# Patient Record
Sex: Male | Born: 1950
Health system: Southern US, Community
[De-identification: ages and names within clinical notes are randomized; demographics above are authoritative.]

## PROBLEM LIST (undated history)

## (undated) DIAGNOSIS — Z8679 Personal history of other diseases of the circulatory system: Secondary | ICD-10-CM

## (undated) DIAGNOSIS — E7800 Pure hypercholesterolemia, unspecified: Secondary | ICD-10-CM

## (undated) DIAGNOSIS — I10 Essential (primary) hypertension: Secondary | ICD-10-CM

## (undated) DIAGNOSIS — E119 Type 2 diabetes mellitus without complications: Secondary | ICD-10-CM

## (undated) DIAGNOSIS — M1990 Unspecified osteoarthritis, unspecified site: Secondary | ICD-10-CM

## (undated) HISTORY — PX: OTHER SURGICAL HISTORY: SHX169

---

## 2016-01-15 NOTE — H&P (Signed)
TOTAL HIP ADMISSION H&P  Patient is admitted for left total hip arthroplasty, anterior approach.  Subjective:  Chief Complaint:    Left hip primary OA / pain  HPI: Julian Porter, 65 y.o. male, has a history of pain and functional disability in the left hip(s) due to arthritis and patient has failed non-surgical conservative treatments for greater than 12 weeks to include NSAID's and/or analgesics and activity modification.  Onset of symptoms was gradual starting 6-7 years ago with gradually worsening course since that time.The patient noted no past surgery on the left hip(s).  Patient currently rates pain in the left hip at 8 out of 10 with activity. Patient has night pain, worsening of pain with activity and weight bearing, trendelenberg gait, pain that interfers with activities of daily living and pain with passive range of motion. Patient has evidence of periarticular osteophytes and joint space narrowing by imaging studies. This condition presents safety issues increasing the risk of falls.  There is no current active infection.   Risks, benefits and expectations were discussed with the patient.  Risks including but not limited to the risk of anesthesia, blood clots, nerve damage, blood vessel damage, failure of the prosthesis, infection and up to and including death.  Patient understand the risks, benefits and expectations and wishes to proceed with surgery.   PCP: Horatio Pel, MD  D/C Plans:      Home  Post-op Meds:       No Rx given  Tranexamic Acid:      To be given - IV   Decadron:      Is to be given  FYI:     ASA  Norco     Past Medical History:  Diagnosis Date  . Arthritis    OA  . Elevated cholesterol   . History of right bundle branch block   . Hypertension     Past Surgical History:  Procedure Laterality Date  . EYE STY REMOVED  2 1/2 YRS AGO  . INGROWN HAIR REMOVED ON HAIR LINE  4 YRS AGO    No prescriptions prior to admission.   No Known Allergies    Social History  Substance Use Topics  . Smoking status: Never Smoker  . Smokeless tobacco: Never Used  . Alcohol use Yes     Comment: [place] [employer]       Review of Systems  Constitutional: Negative.   HENT: Negative.   Eyes: Negative.   Respiratory: Negative.   Cardiovascular: Negative.   Gastrointestinal: Negative.   Genitourinary: Negative.   Musculoskeletal: Positive for joint pain.  Skin: Negative.   Neurological: Negative.   Endo/Heme/Allergies: Negative.   Psychiatric/Behavioral: Negative.     Objective:  Physical Exam  Constitutional: He is oriented to person, place, and time. He appears well-developed.  HENT:  Head: Normocephalic.  Eyes: Pupils are equal, round, and reactive to light.  Neck: Neck supple. No JVD present. No tracheal deviation present. No thyromegaly present.  Cardiovascular: Normal rate, regular rhythm, normal heart sounds and intact distal pulses.   Respiratory: Effort normal and breath sounds normal. No respiratory distress. He has no wheezes.  GI: Soft. There is no tenderness. There is no guarding.  Musculoskeletal:       Left hip: He exhibits decreased range of motion, decreased strength, tenderness and bony tenderness. He exhibits no swelling, no deformity and no laceration.  Lymphadenopathy:    He has no cervical adenopathy.  Neurological: He is alert and oriented to person, place, and  time.  Skin: Skin is warm and dry.  Psychiatric: He has a normal mood and affect.      Imaging Review Plain radiographs demonstrate severe degenerative joint disease of the left hip(s). The bone quality appears to be good for age and reported activity level.  Assessment/Plan:  End stage arthritis, left hip(s)  The patient history, physical examination, clinical judgement of the provider and imaging studies are consistent with end stage degenerative joint disease of the left hip(s) and total hip arthroplasty is deemed medically necessary. The treatment  options including medical management, injection therapy, arthroscopy and arthroplasty were discussed at length. The risks and benefits of total hip arthroplasty were presented and reviewed. The risks due to aseptic loosening, infection, stiffness, dislocation/subluxation,  thromboembolic complications and other imponderables were discussed.  The patient acknowledged the explanation, agreed to proceed with the plan and consent was signed. Patient is being admitted for inpatient treatment for surgery, pain control, PT, OT, prophylactic antibiotics, VTE prophylaxis, progressive ambulation and ADL's and discharge planning.The patient is planning to be discharged home.     West Pugh Riku Buttery   PA-C  01/26/2016, 7:43 AM

## 2016-01-20 ENCOUNTER — Other Ambulatory Visit (HOSPITAL_COMMUNITY): Payer: Self-pay | Admitting: *Deleted

## 2016-01-20 NOTE — Patient Instructions (Signed)
Julian Porter  01/20/2016   Your procedure is scheduled on: 01-27-16  Report to [facility] Main  Entrance take [facility]  elevators to 3rd floor to  [place] at 600 AM.  Call this number if you have problems the morning of surgery [phone]   Remember: ONLY 1 PERSON MAY GO WITH YOU TO SHORT STAY TO GET  READY MORNING OF [place].  Do not eat food or drink liquids :After Midnight.     Take these medicines the morning of surgery with A SIP OF WATER: simvastatin (zocor)              You may not have any metal on your body including hair pins and              piercings  Do not wear jewelry, make-up, lotions, powders or perfumes, deodorant             Do not wear nail polish.  Do not shave  48 hours prior to surgery.              Men may shave face and neck.   Do not bring valuables to the hospital. [place].  Contacts, dentures or bridgework may not be worn into surgery.  Leave suitcase in the car. After surgery it may be brought to your room.               Please read over the following fact sheets you were given: _____________________________________________________________________             [facility] - Preparing for Surgery Before surgery, you can play an important role.  Because skin is not sterile, your skin needs to be as free of germs as possible.  You can reduce the number of germs on your skin by washing with CHG (chlorahexidine gluconate) soap before surgery.  CHG is an antiseptic cleaner which kills germs and bonds with the skin to continue killing germs even after washing. Please DO NOT use if you have an allergy to CHG or antibacterial soaps.  If your skin becomes reddened/irritated stop using the CHG and inform your nurse when you arrive at Short Stay. Do not shave (including legs and underarms) for at least 48 hours prior to the first CHG shower.  You may shave your face/neck. Please  follow these instructions carefully:  1.  Shower with CHG Soap the night before surgery and the  morning of Surgery.  2.  If you choose to wash your hair, wash your hair first as usual with your  normal  shampoo.  3.  After you shampoo, rinse your hair and body thoroughly to remove the  shampoo.                           4.  Use CHG as you would any other liquid soap.  You can apply chg directly  to the skin and wash                       Gently with a scrungie or clean washcloth.  5.  Apply the CHG Soap to your body ONLY FROM THE NECK DOWN.   Do not use on face/ open  Wound or open sores. Avoid contact with eyes, ears mouth and genitals (private parts).                       Wash face,  Genitals (private parts) with your normal soap.             6.  Wash thoroughly, paying special attention to the area where your surgery  will be performed.  7.  Thoroughly rinse your body with warm water from the neck down.  8.  DO NOT shower/wash with your normal soap after using and rinsing off  the CHG Soap.                9.  Pat yourself dry with a clean towel.            10.  Wear clean pajamas.            11.  Place clean sheets on your bed the night of your first shower and do not  sleep with pets. Day of Surgery : Do not apply any lotions/deodorants the morning of surgery.  Please wear clean clothes to the hospital/surgery center.  FAILURE TO FOLLOW THESE INSTRUCTIONS MAY RESULT IN THE CANCELLATION OF YOUR SURGERY PATIENT SIGNATURE_________________________________  NURSE SIGNATURE__________________________________  ________________________________________________________________________   Julian Porter  An incentive spirometer is a tool that can help keep your lungs clear and active. This tool measures how well you are filling your lungs with each breath. Taking long deep breaths may help reverse or decrease the chance of developing breathing (pulmonary) problems  (especially infection) following:  A long period of time when you are unable to move or be active. BEFORE THE PROCEDURE   If the spirometer includes an indicator to show your best effort, your nurse or respiratory therapist will set it to a desired goal.  If possible, sit up straight or lean slightly forward. Try not to slouch.  Hold the incentive spirometer in an upright position. INSTRUCTIONS FOR USE  1. Sit on the edge of your bed if possible, or sit up as far as you can in bed or on a chair. 2. Hold the incentive spirometer in an upright position. 3. Breathe out normally. 4. Place the mouthpiece in your mouth and seal your lips tightly around it. 5. Breathe in slowly and as deeply as possible, raising the piston or the ball toward the top of the column. 6. Hold your breath for 3-5 seconds or for as long as possible. Allow the piston or ball to fall to the bottom of the column. 7. Remove the mouthpiece from your mouth and breathe out normally. 8. Rest for a few seconds and repeat Steps 1 through 7 at least 10 times every 1-2 hours when you are awake. Take your time and take a few normal breaths between deep breaths. 9. The spirometer may include an indicator to show your best effort. Use the indicator as a goal to work toward during each repetition. 10. After each set of 10 deep breaths, practice coughing to be sure your lungs are clear. If you have an incision (the cut made at the time of surgery), support your incision when coughing by placing a pillow or rolled up towels firmly against it. Once you are able to get out of bed, walk around indoors and cough well. You may stop using the incentive spirometer when instructed by your caregiver.  RISKS AND COMPLICATIONS  Take your time so you do not get  dizzy or light-headed.  If you are in pain, you may need to take or ask for pain medication before doing incentive spirometry. It is harder to take a deep breath if you are having  pain. AFTER USE  Rest and breathe slowly and easily.  It can be helpful to keep track of a log of your progress. Your caregiver can provide you with a simple table to help with this. If you are using the spirometer at home, follow these instructions: [place] IF:   You are having difficultly using the spirometer.  You have trouble using the spirometer as often as instructed.  Your pain medication is not giving enough relief while using the spirometer.  You develop fever of 100.5 F (38.1 C) or higher. SEEK IMMEDIATE MEDICAL CARE IF:   You cough up bloody sputum that had not been present before.  You develop fever of 102 F (38.9 C) or greater.  You develop worsening pain at or near the incision site. MAKE SURE YOU:   Understand these instructions.  Will watch your condition.  Will get help right away if you are not doing well or get worse. Document Released: 08/23/2006 Document Revised: 07/05/2011 Document Reviewed: 10/24/2006 ExitCare Patient Information 2014 ExitCare, [place].   ________________________________________________________________________  WHAT IS A BLOOD TRANSFUSION? Blood Transfusion Information  A transfusion is the replacement of blood or some of its parts. Blood is made up of multiple cells which provide different functions.  Red blood cells carry oxygen and are used for blood loss replacement.  White blood cells fight against infection.  Platelets control bleeding.  Plasma helps clot blood.  Other blood products are available for specialized needs, such as hemophilia or other clotting disorders. BEFORE THE TRANSFUSION  Who gives blood for transfusions?   Healthy volunteers who are fully evaluated to make sure their blood is safe. This is blood bank blood. Transfusion therapy is the safest it has ever been in the practice of medicine. Before blood is taken from a donor, a complete history is taken to make sure that person has no history  of diseases nor engages in risky social behavior (examples are intravenous drug use or sexual activity with multiple partners). The donor's travel history is screened to minimize risk of transmitting infections, such as malaria. The donated blood is tested for signs of infectious diseases, such as HIV and hepatitis. The blood is then tested to be sure it is compatible with you in order to minimize the chance of a transfusion reaction. If you or a relative donates blood, this is often done in anticipation of surgery and is not appropriate for emergency situations. It takes many days to process the donated blood. RISKS AND COMPLICATIONS Although transfusion therapy is very safe and saves many lives, the main dangers of transfusion include:   Getting an infectious disease.  Developing a transfusion reaction. This is an allergic reaction to something in the blood you were given. Every precaution is taken to prevent this. The decision to have a blood transfusion has been considered carefully by your caregiver before blood is given. Blood is not given unless the benefits outweigh the risks. AFTER THE TRANSFUSION  Right after receiving a blood transfusion, you will usually feel much better and more energetic. This is especially true if your red blood cells have gotten low (anemic). The transfusion raises the level of the red blood cells which carry oxygen, and this usually causes an energy increase.  The nurse administering the transfusion will  monitor you carefully for complications. HOME CARE INSTRUCTIONS  No special instructions are needed after a transfusion. You may find your energy is better. Speak with your caregiver about any limitations on activity for underlying diseases you may have. SEEK MEDICAL CARE IF:   Your condition is not improving after your transfusion.  You develop redness or irritation at the intravenous (IV) site. SEEK IMMEDIATE MEDICAL CARE IF:  Any of the following symptoms  occur over the next 12 hours:  Shaking chills.  You have a temperature by mouth above 102 F (38.9 C), not controlled by medicine.  Chest, back, or muscle pain.  People around you feel you are not acting correctly or are confused.  Shortness of breath or difficulty breathing.  Dizziness and fainting.  You get a rash or develop hives.  You have a decrease in urine output.  Your urine turns a dark color or changes to pink, red, or brown. Any of the following symptoms occur over the next 10 days:  You have a temperature by mouth above 102 F (38.9 C), not controlled by medicine.  Shortness of breath.  Weakness after normal activity.  The white part of the eye turns yellow (jaundice).  You have a decrease in the amount of urine or are urinating less often.  Your urine turns a dark color or changes to pink, red, or brown. Document Released: 04/09/2000 Document Revised: 07/05/2011 Document Reviewed: 11/27/2007 [facility] Patient Information 2014 [place], [place].  _______________________________________________________________________

## 2016-01-20 NOTE — Progress Notes (Signed)
ekg with lov/clearance note all dated  dr Wynonia Lawman 01-13-16 on chart

## 2016-01-21 ENCOUNTER — Encounter (HOSPITAL_COMMUNITY)
Admission: RE | Admit: 2016-01-21 | Discharge: 2016-01-21 | Disposition: A | Discharge status: Home / Self Care | Payer: Managed Care, Other (non HMO) | Source: Ambulatory Visit | Attending: Orthopedic Surgery | Admitting: Orthopedic Surgery

## 2016-01-21 ENCOUNTER — Encounter (HOSPITAL_COMMUNITY): Payer: Self-pay

## 2016-01-21 DIAGNOSIS — M1612 Unilateral primary osteoarthritis, left hip: Secondary | ICD-10-CM | POA: Insufficient documentation

## 2016-01-21 DIAGNOSIS — Z01812 Encounter for preprocedural laboratory examination: Secondary | ICD-10-CM | POA: Insufficient documentation

## 2016-01-21 DIAGNOSIS — Z0183 Encounter for blood typing: Secondary | ICD-10-CM | POA: Insufficient documentation

## 2016-01-21 HISTORY — DX: Pure hypercholesterolemia, unspecified: E78.00

## 2016-01-21 HISTORY — DX: Essential (primary) hypertension: I10

## 2016-01-21 HISTORY — DX: Unspecified osteoarthritis, unspecified site: M19.90

## 2016-01-21 HISTORY — DX: Personal history of other diseases of the circulatory system: Z86.79

## 2016-01-21 LAB — BASIC METABOLIC PANEL WITH GFR
Anion gap: 7 (ref 5–15)
BUN: 14 mg/dL (ref 6–20)
CO2: 25 mmol/L (ref 22–32)
Calcium: 9.7 mg/dL (ref 8.9–10.3)
Chloride: 105 mmol/L (ref 101–111)
Creatinine, Ser: 0.92 mg/dL (ref 0.61–1.24)
GFR calc Af Amer: >60 mL/min (ref >60)
GFR calc non Af Amer: >60 mL/min (ref >60)
Glucose, Bld: 107 mg/dL — ABNORMAL HIGH (ref 65–99)
Potassium: 4.1 mmol/L (ref 3.5–5.1)
Sodium: 137 mmol/L (ref 135–145)

## 2016-01-21 LAB — CBC
HCT: 41.3 % (ref 39.0–52.0)
Hemoglobin: 14.7 g/dL (ref 13.0–17.0)
MCH: 29.8 pg (ref 26.0–34.0)
MCHC: 35.6 g/dL (ref 30.0–36.0)
MCV: 83.6 fL (ref 78.0–100.0)
Platelets: 199 K/uL (ref 150–400)
RBC: 4.94 MIL/uL (ref 4.22–5.81)
RDW: 12.6 % (ref 11.5–15.5)
WBC: 3.6 K/uL — ABNORMAL LOW (ref 4.0–10.5)

## 2016-01-21 LAB — ABO/RH: ABO/RH(D): B POS

## 2016-01-21 LAB — SURGICAL PCR SCREEN
MRSA, PCR: NEGATIVE
STAPHYLOCOCCUS AUREUS: NEGATIVE

## 2016-01-21 NOTE — Progress Notes (Signed)
   01/21/16 0941  OBSTRUCTIVE SLEEP APNEA  Have you ever been diagnosed with sleep apnea through a sleep study? No  Do you snore loudly (loud enough to be heard through closed doors)?  1  Do you often feel tired, fatigued, or sleepy during the daytime (such as falling asleep during driving or talking to someone)? 0  Has anyone observed you stop breathing during your sleep? 0  Do you have, or are you being treated for high blood pressure? 1  BMI more than 35 kg/m2? 1  Age > 50 (1-yes) 1  Neck circumference greater than:Male 16 inches or larger, Male 17inches or larger? 1  Male Gender (Yes=1) 1  Obstructive Sleep Apnea Score 6

## 2016-01-26 MED ORDER — DEXTROSE 5 % IV SOLN
3.0000 g | INTRAVENOUS | Status: AC
  Administered 2016-01-27: 3 g via INTRAVENOUS
  Filled 2016-01-26: qty 3

## 2016-01-27 ENCOUNTER — Encounter (HOSPITAL_COMMUNITY)
Admission: RE | Disposition: A | Discharge status: Home Health | Payer: Self-pay | Source: Ambulatory Visit | Attending: Orthopedic Surgery

## 2016-01-27 ENCOUNTER — Inpatient Hospital Stay (HOSPITAL_COMMUNITY): Payer: Managed Care, Other (non HMO) | Admitting: Certified Registered Nurse Anesthetist

## 2016-01-27 ENCOUNTER — Inpatient Hospital Stay (HOSPITAL_COMMUNITY)
Admission: RE | Admit: 2016-01-27 | Discharge: 2016-01-28 | DRG: 470 | Disposition: A | Discharge status: Home Health | Payer: Managed Care, Other (non HMO) | Source: Ambulatory Visit | Attending: Orthopedic Surgery | Admitting: Orthopedic Surgery

## 2016-01-27 ENCOUNTER — Encounter (HOSPITAL_COMMUNITY): Payer: Self-pay | Admitting: Anesthesiology

## 2016-01-27 ENCOUNTER — Inpatient Hospital Stay (HOSPITAL_COMMUNITY): Payer: Managed Care, Other (non HMO)

## 2016-01-27 DIAGNOSIS — Z6837 Body mass index (BMI) 37.0-37.9, adult: Secondary | ICD-10-CM

## 2016-01-27 DIAGNOSIS — I4510 Unspecified right bundle-branch block: Secondary | ICD-10-CM | POA: Diagnosis present

## 2016-01-27 DIAGNOSIS — E7800 Pure hypercholesterolemia, unspecified: Secondary | ICD-10-CM | POA: Diagnosis present

## 2016-01-27 DIAGNOSIS — E669 Obesity, unspecified: Secondary | ICD-10-CM | POA: Diagnosis present

## 2016-01-27 DIAGNOSIS — M1612 Unilateral primary osteoarthritis, left hip: Secondary | ICD-10-CM | POA: Diagnosis present

## 2016-01-27 DIAGNOSIS — I10 Essential (primary) hypertension: Secondary | ICD-10-CM | POA: Diagnosis present

## 2016-01-27 DIAGNOSIS — Z79899 Other long term (current) drug therapy: Secondary | ICD-10-CM

## 2016-01-27 DIAGNOSIS — R262 Difficulty in walking, not elsewhere classified: Secondary | ICD-10-CM

## 2016-01-27 DIAGNOSIS — Z96649 Presence of unspecified artificial hip joint: Secondary | ICD-10-CM

## 2016-01-27 HISTORY — PX: TOTAL HIP ARTHROPLASTY: SHX124

## 2016-01-27 LAB — TYPE AND SCREEN
ABO/RH(D): B POS
Antibody Screen: NEGATIVE

## 2016-01-27 SURGERY — ARTHROPLASTY, HIP, TOTAL, ANTERIOR APPROACH
Anesthesia: Spinal | Site: Hip | Laterality: Left

## 2016-01-27 MED ORDER — STERILE WATER FOR IRRIGATION IR SOLN
Status: DC | PRN
  Administered 2016-01-27: 2000 mL

## 2016-01-27 MED ORDER — BUPIVACAINE HCL (PF) 0.25 % IJ SOLN
INTRAMUSCULAR | Status: AC
  Filled 2016-01-27: qty 30

## 2016-01-27 MED ORDER — METOCLOPRAMIDE HCL 5 MG/ML IJ SOLN: 5.0000 mg | Freq: Three times a day (TID) | INTRAMUSCULAR | Status: DC | PRN

## 2016-01-27 MED ORDER — PROPOFOL 10 MG/ML IV BOLUS
INTRAVENOUS | Status: DC | PRN
  Administered 2016-01-27 (×3): 20 mg via INTRAVENOUS

## 2016-01-27 MED ORDER — PROMETHAZINE HCL 25 MG/ML IJ SOLN: 6.2500 mg | INTRAMUSCULAR | Status: DC | PRN

## 2016-01-27 MED ORDER — MIDAZOLAM HCL 5 MG/5ML IJ SOLN
INTRAMUSCULAR | Status: DC | PRN
  Administered 2016-01-27: 2 mg via INTRAVENOUS

## 2016-01-27 MED ORDER — PHENYLEPHRINE 40 MCG/ML (10ML) SYRINGE FOR IV PUSH (FOR BLOOD PRESSURE SUPPORT)
PREFILLED_SYRINGE | INTRAVENOUS | Status: AC
  Filled 2016-01-27: qty 10

## 2016-01-27 MED ORDER — DEXAMETHASONE SODIUM PHOSPHATE 10 MG/ML IJ SOLN
10.0000 mg | Freq: Once | INTRAMUSCULAR | Status: AC
  Administered 2016-01-27: 10 mg via INTRAVENOUS
  Filled 2016-01-27: qty 1

## 2016-01-27 MED ORDER — EPHEDRINE 5 MG/ML INJ
INTRAVENOUS | Status: AC
  Filled 2016-01-27: qty 10

## 2016-01-27 MED ORDER — DIPHENHYDRAMINE HCL 25 MG PO CAPS: 25.0000 mg | ORAL_CAPSULE | Freq: Four times a day (QID) | ORAL | Status: DC | PRN

## 2016-01-27 MED ORDER — MAGNESIUM CITRATE PO SOLN
1.0000 | Freq: Once | ORAL | Status: DC | PRN
Start: 2016-01-27 — End: 2016-01-28

## 2016-01-27 MED ORDER — POLYETHYLENE GLYCOL 3350 17 G PO PACK
17.0000 g | PACK | Freq: Two times a day (BID) | ORAL | Status: DC
  Administered 2016-01-28: 17 g via ORAL
  Filled 2016-01-27: qty 1

## 2016-01-27 MED ORDER — MENTHOL 3 MG MT LOZG: 1.0000 | LOZENGE | OROMUCOSAL | Status: DC | PRN

## 2016-01-27 MED ORDER — ONDANSETRON HCL 4 MG/2ML IJ SOLN
INTRAMUSCULAR | Status: AC
Start: 2016-01-27 — End: 2016-01-27
  Filled 2016-01-27: qty 2

## 2016-01-27 MED ORDER — FENTANYL CITRATE (PF) 100 MCG/2ML IJ SOLN
INTRAMUSCULAR | Status: DC | PRN
Start: 2016-01-27 — End: 2016-01-27
  Administered 2016-01-27 (×2): 50 ug via INTRAVENOUS

## 2016-01-27 MED ORDER — 0.9 % SODIUM CHLORIDE (POUR BTL) OPTIME
TOPICAL | Status: DC | PRN
  Administered 2016-01-27: 1000 mL

## 2016-01-27 MED ORDER — DEXAMETHASONE SODIUM PHOSPHATE 10 MG/ML IJ SOLN
10.0000 mg | Freq: Once | INTRAMUSCULAR | Status: AC
Start: 2016-01-28 — End: 2016-01-28
  Administered 2016-01-28: 10 mg via INTRAVENOUS
  Filled 2016-01-27: qty 1

## 2016-01-27 MED ORDER — HYDROMORPHONE HCL 1 MG/ML IJ SOLN: 0.2500 mg | INTRAMUSCULAR | Status: DC | PRN

## 2016-01-27 MED ORDER — METOCLOPRAMIDE HCL 5 MG PO TABS: 5.0000 mg | ORAL_TABLET | Freq: Three times a day (TID) | ORAL | Status: DC | PRN

## 2016-01-27 MED ORDER — DEXAMETHASONE SODIUM PHOSPHATE 10 MG/ML IJ SOLN
INTRAMUSCULAR | Status: AC
  Filled 2016-01-27: qty 1

## 2016-01-27 MED ORDER — CELECOXIB 200 MG PO CAPS
200.0000 mg | ORAL_CAPSULE | Freq: Two times a day (BID) | ORAL | Status: DC
  Administered 2016-01-27 – 2016-01-28 (×2): 200 mg via ORAL
  Filled 2016-01-27 (×2): qty 1

## 2016-01-27 MED ORDER — LACTATED RINGERS IV SOLN
INTRAVENOUS | Status: AC
  Administered 2016-01-27 (×2): via INTRAVENOUS
  Administered 2016-01-27: 1000 mL via INTRAVENOUS

## 2016-01-27 MED ORDER — ASPIRIN 81 MG PO CHEW
81.0000 mg | CHEWABLE_TABLET | Freq: Two times a day (BID) | ORAL | Status: DC
  Administered 2016-01-27 – 2016-01-28 (×2): 81 mg via ORAL
  Filled 2016-01-27 (×2): qty 1

## 2016-01-27 MED ORDER — SODIUM CHLORIDE 0.9 % IV SOLN
1000.0000 mg | INTRAVENOUS | Status: AC
  Administered 2016-01-27: 1000 mg via INTRAVENOUS
  Filled 2016-01-27: qty 1100

## 2016-01-27 MED ORDER — MIDAZOLAM HCL 2 MG/2ML IJ SOLN
INTRAMUSCULAR | Status: AC
  Filled 2016-01-27: qty 2

## 2016-01-27 MED ORDER — FERROUS SULFATE 325 (65 FE) MG PO TABS
325.0000 mg | ORAL_TABLET | Freq: Three times a day (TID) | ORAL | Status: DC
  Administered 2016-01-27: 325 mg via ORAL
  Filled 2016-01-27: qty 1

## 2016-01-27 MED ORDER — METHOCARBAMOL 1000 MG/10ML IJ SOLN
500.0000 mg | Freq: Four times a day (QID) | INTRAVENOUS | Status: DC | PRN
  Administered 2016-01-27: 500 mg via INTRAVENOUS
  Filled 2016-01-27: qty 5
  Filled 2016-01-27: qty 550

## 2016-01-27 MED ORDER — KETOROLAC TROMETHAMINE 30 MG/ML IJ SOLN
INTRAMUSCULAR | Status: AC
  Filled 2016-01-27: qty 1

## 2016-01-27 MED ORDER — ALUM & MAG HYDROXIDE-SIMETH 200-200-20 MG/5ML PO SUSP: 30.0000 mL | ORAL | Status: DC | PRN

## 2016-01-27 MED ORDER — BISACODYL 10 MG RE SUPP: 10.0000 mg | Freq: Every day | RECTAL | Status: DC | PRN

## 2016-01-27 MED ORDER — SODIUM CHLORIDE 0.9 % IV SOLN
100.0000 mL/h | INTRAVENOUS | Status: DC
  Administered 2016-01-27 – 2016-01-28 (×2): 100 mL/h via INTRAVENOUS
  Filled 2016-01-27 (×4): qty 1000

## 2016-01-27 MED ORDER — PROPOFOL 10 MG/ML IV BOLUS
INTRAVENOUS | Status: AC
  Filled 2016-01-27: qty 40

## 2016-01-27 MED ORDER — METHOCARBAMOL 500 MG PO TABS
500.0000 mg | ORAL_TABLET | Freq: Four times a day (QID) | ORAL | Status: DC | PRN
  Administered 2016-01-27 – 2016-01-28 (×2): 500 mg via ORAL
  Filled 2016-01-27 (×2): qty 1

## 2016-01-27 MED ORDER — DEXAMETHASONE SODIUM PHOSPHATE 10 MG/ML IJ SOLN
INTRAMUSCULAR | Status: DC | PRN
  Administered 2016-01-27: 10 mg via INTRAVENOUS

## 2016-01-27 MED ORDER — PHENYLEPHRINE 40 MCG/ML (10ML) SYRINGE FOR IV PUSH (FOR BLOOD PRESSURE SUPPORT)
PREFILLED_SYRINGE | INTRAVENOUS | Status: DC | PRN
  Administered 2016-01-27 (×3): 40 ug via INTRAVENOUS
  Administered 2016-01-27 (×2): 80 ug via INTRAVENOUS
  Administered 2016-01-27: 40 ug via INTRAVENOUS
  Administered 2016-01-27 (×4): 80 ug via INTRAVENOUS
  Administered 2016-01-27: 40 ug via INTRAVENOUS

## 2016-01-27 MED ORDER — FENTANYL CITRATE (PF) 100 MCG/2ML IJ SOLN
INTRAMUSCULAR | Status: AC
  Filled 2016-01-27: qty 2

## 2016-01-27 MED ORDER — ONDANSETRON HCL 4 MG/2ML IJ SOLN: 4.0000 mg | Freq: Four times a day (QID) | INTRAMUSCULAR | Status: DC | PRN

## 2016-01-27 MED ORDER — PHENOL 1.4 % MT LIQD: 1.0000 | OROMUCOSAL | Status: DC | PRN

## 2016-01-27 MED ORDER — EPHEDRINE SULFATE-NACL 50-0.9 MG/10ML-% IV SOSY
PREFILLED_SYRINGE | INTRAVENOUS | Status: DC | PRN
  Administered 2016-01-27: 5 mg via INTRAVENOUS
  Administered 2016-01-27 (×2): 10 mg via INTRAVENOUS

## 2016-01-27 MED ORDER — ONDANSETRON HCL 4 MG/2ML IJ SOLN
INTRAMUSCULAR | Status: DC | PRN
  Administered 2016-01-27: 4 mg via INTRAVENOUS

## 2016-01-27 MED ORDER — BUPIVACAINE HCL (PF) 0.5 % IJ SOLN
INTRAMUSCULAR | Status: DC | PRN
  Administered 2016-01-27: 3 mL

## 2016-01-27 MED ORDER — AMLODIPINE BESYLATE 10 MG PO TABS
10.0000 mg | ORAL_TABLET | Freq: Every evening | ORAL | Status: DC
  Administered 2016-01-27: 10 mg via ORAL
  Filled 2016-01-27: qty 1

## 2016-01-27 MED ORDER — PROPOFOL 10 MG/ML IV BOLUS
INTRAVENOUS | Status: AC
  Filled 2016-01-27: qty 60

## 2016-01-27 MED ORDER — SIMVASTATIN 20 MG PO TABS: 20.0000 mg | ORAL_TABLET | Freq: Every day | ORAL | Status: DC

## 2016-01-27 MED ORDER — BUPIVACAINE HCL (PF) 0.5 % IJ SOLN
INTRAMUSCULAR | Status: AC
  Filled 2016-01-27: qty 30

## 2016-01-27 MED ORDER — PROPOFOL 500 MG/50ML IV EMUL
INTRAVENOUS | Status: DC | PRN
  Administered 2016-01-27: 50 ug/kg/min via INTRAVENOUS

## 2016-01-27 MED ORDER — HYDROCODONE-ACETAMINOPHEN 7.5-325 MG PO TABS
1.0000 | ORAL_TABLET | ORAL | Status: DC
  Administered 2016-01-27: 1 via ORAL
  Administered 2016-01-27 – 2016-01-28 (×5): 2 via ORAL
  Filled 2016-01-27: qty 2
  Filled 2016-01-27: qty 1
  Filled 2016-01-27 (×4): qty 2

## 2016-01-27 MED ORDER — DOCUSATE SODIUM 100 MG PO CAPS
100.0000 mg | ORAL_CAPSULE | Freq: Two times a day (BID) | ORAL | Status: DC
  Administered 2016-01-27 – 2016-01-28 (×2): 100 mg via ORAL
  Filled 2016-01-27 (×2): qty 1

## 2016-01-27 MED ORDER — SODIUM CHLORIDE 0.9 % IJ SOLN
INTRAMUSCULAR | Status: AC
  Filled 2016-01-27: qty 50

## 2016-01-27 MED ORDER — HYDROMORPHONE HCL 1 MG/ML IJ SOLN
0.5000 mg | INTRAMUSCULAR | Status: DC | PRN
  Administered 2016-01-27: 1 mg via INTRAVENOUS
  Administered 2016-01-27: 0.5 mg via INTRAVENOUS
  Administered 2016-01-27: 1 mg via INTRAVENOUS
  Filled 2016-01-27 (×3): qty 1

## 2016-01-27 MED ORDER — CEFAZOLIN SODIUM-DEXTROSE 2-4 GM/100ML-% IV SOLN
2.0000 g | Freq: Four times a day (QID) | INTRAVENOUS | Status: AC
  Administered 2016-01-27 (×2): 2 g via INTRAVENOUS
  Filled 2016-01-27 (×2): qty 100

## 2016-01-27 MED ORDER — ONDANSETRON HCL 4 MG PO TABS: 4.0000 mg | ORAL_TABLET | Freq: Four times a day (QID) | ORAL | Status: DC | PRN

## 2016-01-27 SURGICAL SUPPLY — 37 items
BAG ZIPLOCK 12X15 (MISCELLANEOUS) ×2 IMPLANT
CAPT HIP TOTAL 2 ×2 IMPLANT
CLOTH BEACON ORANGE TIMEOUT ST (SAFETY) ×2 IMPLANT
COVER PERINEAL POST (MISCELLANEOUS) ×2 IMPLANT
DERMABOND ADVANCED (GAUZE/BANDAGES/DRESSINGS) ×1
DERMABOND ADVANCED .7 DNX12 (GAUZE/BANDAGES/DRESSINGS) ×1 IMPLANT
DRAPE STERI IOBAN 125X83 (DRAPES) ×2 IMPLANT
DRAPE U-SHAPE 47X51 STRL (DRAPES) ×4 IMPLANT
DRESSING AQUACEL AG SP 3.5X10 (GAUZE/BANDAGES/DRESSINGS) ×1 IMPLANT
DRSG AQUACEL AG SP 3.5X10 (GAUZE/BANDAGES/DRESSINGS) ×2
DURAPREP 26ML APPLICATOR (WOUND CARE) ×2 IMPLANT
ELECT REM PT RETURN 9FT ADLT (ELECTROSURGICAL) ×2
ELECTRODE REM PT RTRN 9FT ADLT (ELECTROSURGICAL) ×1 IMPLANT
GLOVE BIOGEL M STRL SZ7.5 (GLOVE) ×2 IMPLANT
GLOVE BIOGEL PI IND STRL 7.0 (GLOVE) ×1 IMPLANT
GLOVE BIOGEL PI IND STRL 7.5 (GLOVE) ×5 IMPLANT
GLOVE BIOGEL PI IND STRL 8 (GLOVE) ×1 IMPLANT
GLOVE BIOGEL PI INDICATOR 7.0 (GLOVE) ×1
GLOVE BIOGEL PI INDICATOR 7.5 (GLOVE) ×5
GLOVE BIOGEL PI INDICATOR 8 (GLOVE) ×1
GLOVE ECLIPSE 8.0 STRL XLNG CF (GLOVE) ×2 IMPLANT
GLOVE ORTHO TXT STRL SZ7.5 (GLOVE) ×2 IMPLANT
GLOVE SURG SS PI 7.0 STRL IVOR (GLOVE) ×2 IMPLANT
GLOVE SURG SS PI 7.5 STRL IVOR (GLOVE) ×2 IMPLANT
GOWN SPEC L3 XXLG W/TWL (GOWN DISPOSABLE) ×2 IMPLANT
GOWN STRL REUS W/TWL LRG LVL3 (GOWN DISPOSABLE) ×2 IMPLANT
GOWN STRL REUS W/TWL XL LVL3 (GOWN DISPOSABLE) ×6 IMPLANT
HOLDER FOLEY CATH W/STRAP (MISCELLANEOUS) ×2 IMPLANT
PACK ANTERIOR HIP CUSTOM (KITS) ×2 IMPLANT
SAW OSC TIP CART 19.5X105X1.3 (SAW) ×2 IMPLANT
SUT MNCRL AB 4-0 PS2 18 (SUTURE) ×2 IMPLANT
SUT VIC AB 1 CT1 36 (SUTURE) ×6 IMPLANT
SUT VIC AB 2-0 CT1 27 (SUTURE) ×2
SUT VIC AB 2-0 CT1 TAPERPNT 27 (SUTURE) ×2 IMPLANT
SUT VLOC 180 0 24IN GS25 (SUTURE) ×2 IMPLANT
TRAY FOLEY W/METER SILVER 16FR (SET/KITS/TRAYS/PACK) ×2 IMPLANT
YANKAUER SUCT BULB TIP 10FT TU (MISCELLANEOUS) ×2 IMPLANT

## 2016-01-27 NOTE — Interval H&P Note (Signed)
History and Physical Interval Note:  01/27/2016 7:02 AM  Julian Porter  has presented today for surgery, with the diagnosis of left hip OA  The various methods of treatment have been discussed with the patient and family. After consideration of risks, benefits and other options for treatment, the patient has consented to  Procedure(s): LEFT TOTAL HIP ARTHROPLASTY ANTERIOR APPROACH left (Left) as a surgical intervention .  The patient's history has been reviewed, patient examined, no change in status, stable for surgery.  I have reviewed the patient's chart and labs.  Questions were answered to the patient's satisfaction.     Mauri Pole

## 2016-01-27 NOTE — Transfer of Care (Signed)
Immediate Anesthesia Transfer of Care Note  Patient: Julian Porter  Procedure(s) Performed: Procedure(s): LEFT TOTAL HIP ARTHROPLASTY ANTERIOR APPROACH (Left)  Patient Location: PACU  Anesthesia Type:Spinal  Level of Consciousness:  sedated, patient cooperative and responds to stimulation  Airway & Oxygen Therapy:Patient Spontanous Breathing and Patient connected to face mask oxgen  Post-op Assessment:  Report given to PACU RN and Post -op Vital signs reviewed and stable  Post vital signs:  Reviewed and stable  Last Vitals:  Vitals:   01/27/16 0538  BP: (!) 173/92  Pulse: 78  Resp: 18  Temp: 123456 C    Complications: No apparent anesthesia complications

## 2016-01-27 NOTE — Evaluation (Signed)
Physical Therapy Evaluation Patient Details Name: Julian Porter MRN: [id number] DOB: [birth year] Today's Date: 01/27/2016   History of Present Illness  65 yo male s/p L THA-direct anterior 01/27/16.   Clinical Impression  On eval POD 0, pt required Min assist for mobility. He walked ~10 feet. Moderate difficulty advancing L LE. Pain rated 7/10. Ambulation distance limited by pain. Will follow and progress activity as tolerated.     Follow Up Recommendations Home health PT    Equipment Recommendations  Rolling walker with 5" wheels    Recommendations for Other Services       Precautions / Restrictions Precautions Precautions: Fall Restrictions Weight Bearing Restrictions: No LLE Weight Bearing: Weight bearing as tolerated      Mobility  Bed Mobility Overal bed mobility: Needs Assistance Bed Mobility: Supine to Sit     Supine to sit: HOB elevated;Min assist     General bed mobility comments: Assist for L LE. Increased time. VCs safety, technique.   Transfers Overall transfer level: Needs assistance Equipment used: Rolling walker (2 wheeled) Transfers: Sit to/from Stand Sit to Stand: Min assist         General transfer comment: VCs safety, technique, hand/LE placement. Assist to rise, stabilize, control descent.   Ambulation/Gait Ambulation/Gait assistance: Min assist Ambulation Distance (Feet): 10 Feet Assistive device: Rolling walker (2 wheeled) Gait Pattern/deviations: Step-to pattern;Trunk flexed;Antalgic     General Gait Details: Pt had difficulty advancing L LE. Increased time. VCs safety, technique, sequence. Distance limited by pain.   Stairs            Wheelchair Mobility    Modified Rankin (Stroke Patients Only)       Balance                                             Pertinent Vitals/Pain Pain Assessment: 0-10 Pain Score: 7  Pain Location: L hip/thigh Pain Descriptors / Indicators: Sore;Tightness;Aching Pain  Intervention(s): Patient requesting pain meds-RN notified;Ice applied;Repositioned    Home Living Family/patient expects to be discharged to:: Private residence Living Arrangements: Spouse/significant other Available Help at Discharge: Family Type of Home: House Home Access: Stairs to enter Entrance Stairs-Rails: None Entrance Stairs-Number of Steps: 2+1 Home Layout: Two level;Able to live on main level with bedroom/bathroom Home Equipment: None      Prior Function Level of Independence: Independent               Hand Dominance        Extremity/Trunk Assessment   Upper Extremity Assessment: Defer to OT evaluation           Lower Extremity Assessment: RLE deficits/detail      Cervical / Trunk Assessment: Normal  Communication   Communication: No difficulties  Cognition Arousal/Alertness: Awake/alert Behavior During Therapy: WFL for tasks assessed/performed Overall Cognitive Status: Within Functional Limits for tasks assessed                      General Comments      Exercises     Assessment/Plan    PT Assessment Patient needs continued PT services  PT Problem List Decreased strength;Decreased mobility;Decreased range of motion;Decreased activity tolerance;Decreased balance;Pain;Decreased knowledge of use of DME          PT Treatment Interventions DME instruction;Therapeutic activities;Gait training;Therapeutic exercise;Stair training;Balance training;Patient/family education;Functional mobility training    PT Goals (Current  goals can be found in the Care Plan section)  Acute Rehab PT Goals Patient Stated Goal: less pain. regain PLOF PT Goal Formulation: With patient/family Time For Goal Achievement: 02/03/16 Potential to Achieve Goals: Good    Frequency 7X/week   Barriers to discharge        Co-evaluation               End of Session Equipment Utilized During Treatment: Gait belt Activity Tolerance: No increased  pain Patient left: in chair;with call bell/phone within reach;with family/visitor present           Time: [id number] PT Time Calculation (min) (ACUTE ONLY): 23 min   Charges:   PT Evaluation $PT Eval Low Complexity: 1 Procedure PT Treatments $Gait Training: 8-22 mins   PT G Codes:        [name], MPT Pager: [phone]

## 2016-01-27 NOTE — Anesthesia Preprocedure Evaluation (Addendum)
Anesthesia Evaluation  Patient identified by MRN, date of birth, ID band Patient awake    Reviewed: Allergy & Precautions, NPO status , Patient's Chart, lab work & pertinent test results  Airway Mallampati: II  TM Distance: >3 FB Neck ROM: Full    Dental  (+)    Pulmonary neg pulmonary ROS,    Pulmonary exam normal breath sounds clear to auscultation       Cardiovascular hypertension, Pt. on medications Normal cardiovascular exam Rhythm:Regular Rate:Normal     Neuro/Psych negative neurological ROS  negative psych ROS   GI/Hepatic negative GI ROS, Neg liver ROS,   Endo/Other  negative endocrine ROS  Renal/GU negative Renal ROS  negative genitourinary   Musculoskeletal  (+) Arthritis ,   Abdominal (+) + obese,   Peds negative pediatric ROS (+)  Hematology negative hematology ROS (+)   Anesthesia Other Findings   Reproductive/Obstetrics negative OB ROS                           Anesthesia Physical Anesthesia Plan  ASA: II  Anesthesia Plan: Spinal   Post-op Pain Management:    Induction: Intravenous  Airway Management Planned: Natural Airway  Additional Equipment:   Intra-op Plan:   Post-operative Plan:   Informed Consent: I have reviewed the patients History and Physical, chart, labs and discussed the procedure including the risks, benefits and alternatives for the proposed anesthesia with the patient or authorized representative who has indicated his/her understanding and acceptance.   Dental advisory given  Plan Discussed with: CRNA  Anesthesia Plan Comments: (Discussed risks and benefits of and differences between spinal and general. Discussed risks of spinal including headache, backache, failure, bleeding and hematoma, infection, and nerve damage. Patient consents to spinal. Questions answered. Platelet count acceptable and no h/o excessive bleeding.)       Anesthesia  Quick Evaluation

## 2016-01-27 NOTE — Op Note (Signed)
NAME:  Julian Porter                ACCOUNT NO.: [id number]      MEDICAL RECORD NO.: [id number]      FACILITY:  [facility]      PHYSICIAN:  Paralee Cancel D  DATE OF BIRTH:  [birth year]     DATE OF PROCEDURE:  01/27/2016                                 OPERATIVE REPORT         PREOPERATIVE DIAGNOSIS: Left  hip osteoarthritis.      POSTOPERATIVE DIAGNOSIS:  Left hip osteoarthritis.      PROCEDURE:  Left total hip replacement through an anterior approach   utilizing DePuy THR system, component size 31mm pinnacle cup, a size 36+4 neutral   Altrex liner, a size 6 Hi Tri Lock stem with a 36+1.5 delta ceramic   ball.      SURGEON:  Pietro Cassis. Alvan Dame, M.D.      ASSISTANT:  Molli Barrows, PA-C     ANESTHESIA:  Spinal.      SPECIMENS:  None.      COMPLICATIONS:  None.      BLOOD LOSS:  600 cc     DRAINS:  None.      INDICATION OF THE PROCEDURE:  Julian Porter is a 65 y.o. male who had   presented to office for evaluation of left hip pain.  Radiographs revealed   progressive degenerative changes with bone-on-bone   articulation to the  hip joint.  The patient had painful limited range of   motion significantly affecting their overall quality of life.  The patient was failing to    respond to conservative measures, and at this point was ready   to proceed with more definitive measures.  The patient has noted progressive   degenerative changes in his hip, progressive problems and dysfunction   with regarding the hip prior to surgery.  Consent was obtained for   benefit of pain relief.  Specific risk of infection, DVT, component   failure, dislocation, need for revision surgery, as well discussion of   the anterior versus posterior approach were reviewed.  Consent was   obtained for benefit of anterior pain relief through an anterior   approach.      PROCEDURE IN DETAIL:  The patient was brought to operative theater.   Once adequate anesthesia, preoperative  antibiotics, 3gm of Ancef, 1 gm of Tranexamic Acid, and 10 mg of Decadron administered.   The patient was positioned supine on the OSI Hanna table.  Once adequate   padding of boney process was carried out, we had predraped out the hip, and  used fluoroscopy to confirm orientation of the pelvis and position.      The left hip was then prepped and draped from proximal iliac crest to   mid thigh with shower curtain technique.      Time-out was performed identifying the patient, planned procedure, and   extremity.     An incision was then made 2 cm distal and lateral to the   anterior superior iliac spine extending over the orientation of the   tensor fascia lata muscle and sharp dissection was carried down to the   fascia of the muscle and protractor placed in the soft tissues.      The fascia was then incised.  The muscle belly was identified and swept   laterally and retractor placed along the superior neck.  Following   cauterization of the circumflex vessels and removing some pericapsular   fat, a second cobra retractor was placed on the inferior neck.  A third   retractor was placed on the anterior acetabulum after elevating the   anterior rectus.  A L-capsulotomy was along the line of the   superior neck to the trochanteric fossa, then extended proximally and   distally.  Tag sutures were placed and the retractors were then placed   intracapsular.  We then identified the trochanteric fossa and   orientation of my neck cut, confirmed this radiographically   and then made a neck osteotomy with the femur on traction.  The femoral   head was removed without difficulty or complication.  Traction was let   off and retractors were placed posterior and anterior around the   acetabulum.      The labrum and foveal tissue were debrided.  I began reaming with a 61mm   reamer and reamed up to 48mm reamer with good bony bed preparation and a 72mm   cup was chosen.  The final 34mm Pinnacle cup  was then impacted under fluoroscopy  to confirm the depth of penetration and orientation with respect to   abduction.  A screw was placed followed by the hole eliminator.  The final   36+4 neutral Altrex liner was impacted with good visualized rim fit.  The cup was positioned anatomically within the acetabular portion of the pelvis.      At this point, the femur was rolled at 80 degrees.  Further capsule was   released off the inferior aspect of the femoral neck.  I then   released the superior capsule proximally.  The hook was placed laterally   along the femur and elevated manually and held in position with the bed   hook.  The leg was then extended and adducted with the leg rolled to 100   degrees of external rotation.  Once the proximal femur was fully   exposed, I used a box osteotome to set orientation.  I then began   broaching with the starting chili pepper broach and passed this by hand and then broached up to 6.  With the 6 broach in place I chose a high offset neck and did several trial reductions.  The offset was appropriate, leg lengths   appeared to be equal best matched with a +1.5 head ball confirmed radiographically.   Given these findings, I went ahead and dislocated the hip, repositioned all   retractors and positioned the right hip in the extended and abducted position.  The final 6 Hi Tri Lock stem was   chosen and it was impacted down to the level of neck cut.  Based on this   and the trial reduction, a 36+1.5 delta ceramic ball was chosen and   impacted onto a clean and dry trunnion, and the hip was reduced.  The   hip had been irrigated throughout the case again at this point.  I did   reapproximate the superior capsular leaflet to the anterior leaflet   using #1 Vicryl.  The fascia of the   tensor fascia lata muscle was then reapproximated using #1 Vicryl and #0 V-lock sutures.  The   remaining wound was closed with 2-0 Vicryl and running 4-0 Monocryl.   The hip was  cleaned, dried, and dressed sterilely using  Dermabond and   Aquacel dressing.  He was then brought   to recovery room in stable condition tolerating the procedure well.    Molli Barrows, PA-C was present for the entirety of the case involved from   preoperative positioning, perioperative retractor management, general   facilitation of the case, as well as primary wound closure as assistant.            Pietro Cassis Alvan Dame, M.D.        01/27/2016 10:03 AM

## 2016-01-27 NOTE — Anesthesia Procedure Notes (Signed)
Spinal  Start time: 01/27/2016 8:35 AM End time: 01/27/2016 8:42 AM Staffing Anesthesiologist: Franne Grip Resident/CRNA: Darlys Gales R Performed: resident/CRNA  Preanesthetic Checklist Completed: patient identified, site marked, surgical consent, pre-op evaluation, timeout performed, IV checked, risks and benefits discussed and monitors and equipment checked Spinal Block Patient position: sitting Prep: ChloraPrep Patient monitoring: heart rate, continuous pulse ox and blood pressure Approach: midline Location: L3-4 Injection technique: single-shot Needle Needle gauge: 24 G Needle length: 10 cm Needle insertion depth: 8 cm Assessment Sensory level: T6

## 2016-01-27 NOTE — Anesthesia Postprocedure Evaluation (Signed)
Anesthesia Post Note  Patient: [name]  Procedure(s) Performed: Procedure(s) (LRB): LEFT TOTAL HIP ARTHROPLASTY ANTERIOR APPROACH (Left)  Patient location during evaluation: PACU Anesthesia Type: Spinal Level of consciousness: oriented and awake and alert Pain management: pain level controlled Vital Signs Assessment: post-procedure vital signs reviewed and stable Respiratory status: spontaneous breathing, respiratory function stable and patient connected to nasal cannula oxygen Cardiovascular status: blood pressure returned to baseline and stable Postop Assessment: no headache, no backache, spinal receding and patient able to bend at knees Anesthetic complications: no    Last Vitals:  Vitals:   01/27/16 1209 01/27/16 1315  BP: 137/80 132/81  Pulse: 63 90  Resp: 14 16  Temp: 36.4 C 36.4 C    Last Pain:  Vitals:   01/27/16 1315  TempSrc: Oral  PainSc:                  Octa Uplinger J

## 2016-01-28 DIAGNOSIS — E669 Obesity, unspecified: Secondary | ICD-10-CM | POA: Diagnosis present

## 2016-01-28 LAB — CBC
HCT: 34.6 % — ABNORMAL LOW (ref 39.0–52.0)
HEMOGLOBIN: 12.9 g/dL — AB (ref 13.0–17.0)
MCH: 30.1 pg (ref 26.0–34.0)
MCHC: 37.3 g/dL — ABNORMAL HIGH (ref 30.0–36.0)
MCV: 80.7 fL (ref 78.0–100.0)
PLATELETS: 171 10*3/uL (ref 150–400)
RBC: 4.29 MIL/uL (ref 4.22–5.81)
RDW: 12.5 % (ref 11.5–15.5)
WBC: 9.4 10*3/uL (ref 4.0–10.5)

## 2016-01-28 LAB — BASIC METABOLIC PANEL
ANION GAP: 7 (ref 5–15)
BUN: 14 mg/dL (ref 6–20)
CO2: 26 mmol/L (ref 22–32)
Calcium: 9.2 mg/dL (ref 8.9–10.3)
Chloride: 103 mmol/L (ref 101–111)
Creatinine, Ser: 0.9 mg/dL (ref 0.61–1.24)
Glucose, Bld: 161 mg/dL — ABNORMAL HIGH (ref 65–99)
POTASSIUM: 4.3 mmol/L (ref 3.5–5.1)
SODIUM: 136 mmol/L (ref 135–145)

## 2016-01-28 MED ORDER — FERROUS SULFATE 325 (65 FE) MG PO TABS: 325.0000 mg | ORAL_TABLET | Freq: Three times a day (TID) | ORAL | 3 refills | Status: DC

## 2016-01-28 MED ORDER — ASPIRIN 81 MG PO CHEW: 81.0000 mg | CHEWABLE_TABLET | Freq: Two times a day (BID) | ORAL | 0 refills | Status: AC

## 2016-01-28 MED ORDER — HYDROCODONE-ACETAMINOPHEN 7.5-325 MG PO TABS: 1.0000 | ORAL_TABLET | ORAL | 0 refills | Status: DC | PRN

## 2016-01-28 MED ORDER — POLYETHYLENE GLYCOL 3350 17 G PO PACK: 17.0000 g | PACK | Freq: Two times a day (BID) | ORAL | 0 refills | Status: DC

## 2016-01-28 MED ORDER — METHOCARBAMOL 500 MG PO TABS: 500.0000 mg | ORAL_TABLET | Freq: Four times a day (QID) | ORAL | 0 refills | Status: DC | PRN

## 2016-01-28 MED ORDER — DOCUSATE SODIUM 100 MG PO CAPS: 100.0000 mg | ORAL_CAPSULE | Freq: Two times a day (BID) | ORAL | 0 refills | Status: DC

## 2016-01-28 NOTE — Evaluation (Signed)
Occupational Therapy Evaluation Patient Details Name: Julian Porter MRN: [id number] DOB: 06-20-1950 Today's Date: 01/28/2016    History of Present Illness 65 yo male s/p L THA-direct anterior 01/27/16.    Clinical Impression    Pt was admitted for the above sx. All education was completed.  No further OT is needed at this time    Follow Up Recommendations  Supervision/Assistance - 24 hour;No OT follow up    Equipment Recommendations  3 in 1 bedside comode    Recommendations for Other Services       Precautions / Restrictions Precautions Precautions: Fall Restrictions Weight Bearing Restrictions: No LLE Weight Bearing: Weight bearing as tolerated      Mobility Bed Mobility               General bed mobility comments: min A for LLE  Transfers Overall transfer level: Needs assistance Equipment used: Rolling walker (2 wheeled) Transfers: Sit to/from Stand Sit to Stand: Min guard         General transfer comment: for safety.  cues for UE placement    Balance                                            ADL Overall ADL's : Needs assistance/impaired     Grooming: Supervision/safety;Standing                   Toilet Transfer: Min guard;Ambulation;BSC;RW   Toileting- Water quality scientist and Hygiene: Min guard;Sit to/from stand   Tub/ Shower Transfer: Walk-in shower;Min guard;Ambulation;3 in 1     General ADL Comments: ambulated to bathroom with min guard and return demonstrated bathroom transfers.  Educated on placement of 3:1 in shower stall. Wife will assist with adls as needed.  Educated on LLE into pants first when he dresses himself, and to work within pain tolerance.  Educated on long netted sponge, if needed     Estate agent      Pertinent Vitals/Pain Pain Assessment: Faces Pain Score: 5  Faces Pain Scale: Hurts little more Pain Location: L hip thigh with weight bearing Pain Descriptors /  Indicators: Sore Pain Intervention(s): Limited activity within patient's tolerance;Monitored during session;Premedicated before session;Repositioned;Ice applied     Hand Dominance     Extremity/Trunk Assessment Upper Extremity Assessment Upper Extremity Assessment: Overall WFL for tasks assessed           Communication Communication Communication: No difficulties   Cognition Arousal/Alertness: Awake/alert Behavior During Therapy: WFL for tasks assessed/performed Overall Cognitive Status: Within Functional Limits for tasks assessed                     General Comments       Exercises       Shoulder Instructions      Home Living Family/patient expects to be discharged to:: Private residence Living Arrangements: Spouse/significant other Available Help at Discharge: Family               Bathroom Shower/Tub: Walk-in Psychologist, prison and probation services: Standard     Home Equipment: None          Prior Functioning/Environment Level of Independence: Independent                 OT Problem List:     OT Treatment/Interventions:  OT Goals(Current goals can be found in the care plan section) Acute Rehab OT Goals Patient Stated Goal: less pain. regain PLOF OT Goal Formulation: All assessment and education complete, DC therapy  OT Frequency:     Barriers to D/C:            Co-evaluation              End of Session    Activity Tolerance: Patient tolerated treatment well Patient left: in chair;with call bell/phone within reach;with family/visitor present   Time: [id number] OT Time Calculation (min): 22 min Charges:  OT General Charges $OT Visit: 1 Procedure OT Evaluation $OT Eval Low Complexity: 1 Procedure G-Codes:    Davene Jobin 2016-02-10, 10:08 AM  Lesle Chris, OTR/L [phone] 02-10-16

## 2016-01-28 NOTE — Progress Notes (Signed)
     Subjective: 1 Day Post-Op Procedure(s) (LRB): LEFT TOTAL HIP ARTHROPLASTY ANTERIOR APPROACH (Left)   Patient reports pain as mild, pain controlled. No events throughout the night. Is very happy with the results of the spinal.  Looking forward to progressing with PT.  Ready to be discharge home, if he does well with PT.   Objective:   VITALS:   Vitals:   01/28/16 0100 01/28/16 0600  BP: (!) 144/77 (!) 147/94  Pulse: 66 82  Resp: 16 20  Temp: 97.8 F (36.6 C) 97.3 F (36.3 C)    Dorsiflexion/Plantar flexion intact Incision: dressing C/D/I No cellulitis present Compartment soft  LABS  Recent Labs  01/28/16 0440  HGB 12.9*  HCT 34.6*  WBC 9.4  PLT 171     Recent Labs  01/28/16 0440  NA 136  K 4.3  BUN 14  CREATININE 0.90  GLUCOSE 161*     Assessment/Plan: 1 Day Post-Op Procedure(s) (LRB): LEFT TOTAL HIP ARTHROPLASTY ANTERIOR APPROACH (Left) Foley cath d/c'ed Advance diet Up with therapy D/C IV fluids Discharge home with home health  Follow up in 2 weeks at [facility]. Follow up with OLIN,Chelsey Kimberley D in 2 weeks.  Contact information:  [facility] [address], [place] [id number]    Obese (BMI 30-39.9) Estimated body mass index is 37.4 kg/m as calculated from the following:   Height as of this encounter: 6' (1.829 m).   Weight as of this encounter: 125.1 kg (275 lb 12 oz). Patient also counseled that weight may inhibit the healing process Patient counseled that losing weight will help with future health issues         West Pugh. Richey Doolittle   PAC  01/28/2016, 7:32 AM

## 2016-01-28 NOTE — Discharge Instructions (Signed)

## 2016-01-28 NOTE — Care Management Note (Signed)
Case Management Note  Patient Details  Name: Julian Porter MRN: [id number] Date of Birth: [birth year]  Subjective/Objective:                  LEFT TOTAL HIP ARTHROPLASTY ANTERIOR APPROACH (Left) Action/Plan: Discharge planning Expected Discharge Date:  01/28/16               Expected Discharge Plan:  [place]  In-House Referral:     Discharge planning Services  CM Consult  Post Acute Care Choice:  Home Health Choice offered to:  Patient  DME Arranged:  3-N-1, Walker rolling DME Agency:  [place]:  PT [facility] Agency:  [facility]  Status of Service:  Completed, signed off  If discussed at [place] of Stay Meetings, dates discussed:    Additional Comments: CM met with pt in room to offer choice; choices are limited bc of [employer].  CM made referral to [facility], [facility], [facility], and [facility] - all declined bc of insurance.  Referral made to [facility] and [facility]audie Mercury of [facility] accepts referral for HHPT.  Cm faxed facesheet, face to face, H&P, OP note, orders , PT eval note, and last progress note.  CM notified [facility] [employer] rep, Reggie to please deliver the rolling walker and 3n1 to room prior to discharge.  No other CM needs were communicated. Dellie Catholic, RN 01/28/2016, 11:50 AM

## 2016-01-28 NOTE — Progress Notes (Signed)
Physical Therapy Treatment Patient Details Name: Julian Porter MRN: [id number] DOB: 03-Apr-1951 Today's Date: 01/28/2016    History of Present Illness 65 yo male s/p L THA-direct anterior 01/27/16.     PT Comments    Progressing well with mobility. Practiced exercises, stair training, and gait training. All education completed. Ready to d/c from PT standpoint.   Follow Up Recommendations  Home health PT     Equipment Recommendations  Rolling walker with 5" wheels    Recommendations for Other Services       Precautions / Restrictions Precautions Precautions: Fall Restrictions Weight Bearing Restrictions: No LLE Weight Bearing: Weight bearing as tolerated    Mobility  Bed Mobility               General bed mobility comments: oob in recliner  Transfers Overall transfer level: Needs assistance Equipment used: Rolling walker (2 wheeled) Transfers: Sit to/from Stand Sit to Stand: Min guard         General transfer comment: close guard for safety. VCs hand/LE placement.   Ambulation/Gait Ambulation/Gait assistance: Min guard Ambulation Distance (Feet): 100 Feet Assistive device: Rolling walker (2 wheeled) Gait Pattern/deviations: Step-to pattern     General Gait Details: VCs safety, seqeunce, distance from RW. close guard for safety.   Stairs Stairs: Yes   Stair Management: Forwards;Step to pattern;One rail Right;With walker Number of Stairs: 2 General stair comments: Practiced climbing 2 steps with 1 rail on right side. Then practiced 1 step with RW. VCs safety, technique, sequence. Wife present to observe.   Wheelchair Mobility    Modified Rankin (Stroke Patients Only)       Balance                                    Cognition Arousal/Alertness: Awake/alert Behavior During Therapy: WFL for tasks assessed/performed Overall Cognitive Status: Within Functional Limits for tasks assessed                      Exercises  Total Joint Exercises Ankle Circles/Pumps: AROM;Both;10 reps;Supine Quad Sets: AROM;Both;10 reps;Supine Heel Slides: AAROM;Left;10 reps;Supine Hip ABduction/ADduction: AAROM;Left;10 reps;Supine Long Arc Quad: AROM;Left;10 reps;Seated    General Comments        Pertinent Vitals/Pain Pain Assessment: 0-10 Pain Score: 5  Pain Location: L hip/thigh Pain Descriptors / Indicators: Sore;Tightness Pain Intervention(s): Repositioned;Ice applied;Monitored during session    Home Living                      Prior Function            PT Goals (current goals can now be found in the care plan section) Progress towards PT goals: Progressing toward goals    Frequency    7X/week      PT Plan Current plan remains appropriate    Co-evaluation             End of Session Equipment Utilized During Treatment: Gait belt Activity Tolerance: Patient tolerated treatment well Patient left: in chair;with call bell/phone within reach;with family/visitor present     Time: [id number] PT Time Calculation (min) (ACUTE ONLY): 20 min  Charges:  $Gait Training: 8-22 mins                    G Codes:      [name], MPT Pager: [phone]

## 2016-01-30 NOTE — Discharge Summary (Signed)
Physician Discharge Summary  Patient ID: Julian Porter MRN: [id number] DOB/AGE: Jun 18, 1950 65 y.o.  Admit date: 01/27/2016 Discharge date: 01/28/2016   Procedures:  Procedure(s) (LRB): LEFT TOTAL HIP ARTHROPLASTY ANTERIOR APPROACH (Left)  Attending Physician:  Dr. Paralee Cancel   Admission Diagnoses:   Left hip primary OA / pain  Discharge Diagnoses:  Principal Problem:   S/P left THA, AA Active Problems:   Obese  Past Medical History:  Diagnosis Date  . Arthritis    OA  . Elevated cholesterol   . History of right bundle branch block   . Hypertension     HPI:    Julian Porter, 65 y.o. male, has a history of pain and functional disability in the left hip(s) due to arthritis and patient has failed non-surgical conservative treatments for greater than 12 weeks to include NSAID's and/or analgesics and activity modification.  Onset of symptoms was gradual starting 6-7 years ago with gradually worsening course since that time.The patient noted no past surgery on the left hip(s).  Patient currently rates pain in the left hip at 8 out of 10 with activity. Patient has night pain, worsening of pain with activity and weight bearing, trendelenberg gait, pain that interfers with activities of daily living and pain with passive range of motion. Patient has evidence of periarticular osteophytes and joint space narrowing by imaging studies. This condition presents safety issues increasing the risk of falls.  There is no current active infection.   Risks, benefits and expectations were discussed with the patient.  Risks including but not limited to the risk of anesthesia, blood clots, nerve damage, blood vessel damage, failure of the prosthesis, infection and up to and including death.  Patient understand the risks, benefits and expectations and wishes to proceed with surgery.  PCP: Horatio Pel, MD   Discharged Condition: good  Hospital Course:  Patient underwent the above stated  procedure on 01/27/2016. Patient tolerated the procedure well and brought to the recovery room in good condition and subsequently to the floor.  POD #1 BP: 147/94 ; Pulse: 82 ; Temp: 97.3 F (36.3 C) ; Resp: 20 Patient reports pain as mild, pain controlled. No events throughout the night. Is very happy with the results of the spinal.  Looking forward to progressing with PT.  Ready to be discharge home. Dorsiflexion/plantar flexion intact, incision: dressing C/D/I, no cellulitis present and compartment soft.   LABS  Basename    HGB     12.9  HCT     34.6    Discharge Exam: General appearance: alert, cooperative and no distress Extremities: Homans sign is negative, no sign of DVT, no edema, redness or tenderness in the calves or thighs and no ulcers, gangrene or trophic changes  Disposition: Home with follow up in 2 weeks   Follow-up Information    Mauri Pole, MD. Schedule an appointment as soon as possible for a visit in 2 week(s).   Specialty:  Orthopedic Surgery Contact information: [address] [place] [address] [phone]        [facility] .   Specialty:  Home Health Services Why:  home health physical therapy Contact information: [address] [place] [phone]        Inc[facility] - D[facility] .   Why:  rolling walker and a 3n1 Contact information: [address] [place] [address] [phone]           Discharge Instructions  Call MD / Call 911    Complete by:  As directed    If you experience chest pain or shortness of breath, CALL 911 and be transported to the hospital emergency room.  If you develope a fever above 101 F, pus (white drainage) or increased drainage or redness at the wound, or calf pain, call your surgeon's office.   Change dressing    Complete by:  As directed    Maintain surgical dressing until follow up in the clinic. If the edges start to pull up, may  reinforce with tape. If the dressing is no longer working, may remove and cover with gauze and tape, but must keep the area dry and clean.  Call with any questions or concerns.   Constipation Prevention    Complete by:  As directed    Drink plenty of fluids.  Prune juice may be helpful.  You may use a stool softener, such as Colace (over the counter) 100 mg twice a day.  Use MiraLax (over the counter) for constipation as needed.   Diet - low sodium heart healthy    Complete by:  As directed    Discharge instructions    Complete by:  As directed    Maintain surgical dressing until follow up in the clinic. If the edges start to pull up, may reinforce with tape. If the dressing is no longer working, may remove and cover with gauze and tape, but must keep the area dry and clean.  Follow up in 2 weeks at [facility]. Call with any questions or concerns.   Increase activity slowly as tolerated    Complete by:  As directed    Weight bearing as tolerated with assist device (walker, cane, etc) as directed, use it as long as suggested by your surgeon or therapist, typically at least 4-6 weeks.   TED hose    Complete by:  As directed    Use stockings (TED hose) for 2 weeks on both leg(s).  You may remove them at night for sleeping.        Medication List    STOP taking these medications   aspirin EC 81 MG tablet Replaced by:  aspirin 81 MG chewable tablet     TAKE these medications   amLODipine 10 MG tablet Commonly known as:  NORVASC Take 10 mg by mouth every evening.   aspirin 81 MG chewable tablet Chew 1 tablet (81 mg total) by mouth 2 (two) times daily. Take for 4 weeks. Replaces:  aspirin EC 81 MG tablet   docusate sodium 100 MG capsule Commonly known as:  COLACE Take 1 capsule (100 mg total) by mouth 2 (two) times daily.   ferrous sulfate 325 (65 FE) MG tablet Take 1 tablet (325 mg total) by mouth 3 (three) times daily after meals.   HYDROcodone-acetaminophen 7.5-325  MG tablet Commonly known as:  NORCO Take 1-2 tablets by mouth every 4 (four) hours as needed for moderate pain.   lisinopril 20 MG tablet Commonly known as:  PRINIVIL,ZESTRIL Take 20 mg by mouth daily.   methocarbamol 500 MG tablet Commonly known as:  ROBAXIN Take 1 tablet (500 mg total) by mouth every 6 (six) hours as needed for muscle spasms.   polyethylene glycol packet Commonly known as:  MIRALAX / GLYCOLAX Take 17 g by mouth 2 (two) times daily.   simvastatin 20 MG tablet Commonly known as:  ZOCOR Take 20 mg by mouth daily.  Signed: West Pugh. Corene Resnick   PA-C  01/30/2016, 11:13 PM

## 2016-03-10 ENCOUNTER — Encounter (HOSPITAL_COMMUNITY): Payer: Self-pay

## 2016-03-10 ENCOUNTER — Emergency Department (HOSPITAL_COMMUNITY)
Admission: EM | Admit: 2016-03-10 | Discharge: 2016-03-11 | Disposition: A | Discharge status: Home / Self Care | Payer: Managed Care, Other (non HMO) | Attending: Emergency Medicine | Admitting: Emergency Medicine

## 2016-03-10 DIAGNOSIS — I10 Essential (primary) hypertension: Secondary | ICD-10-CM | POA: Diagnosis not present

## 2016-03-10 DIAGNOSIS — Z79899 Other long term (current) drug therapy: Secondary | ICD-10-CM | POA: Insufficient documentation

## 2016-03-10 DIAGNOSIS — Z96642 Presence of left artificial hip joint: Secondary | ICD-10-CM | POA: Diagnosis not present

## 2016-03-10 DIAGNOSIS — L509 Urticaria, unspecified: Secondary | ICD-10-CM | POA: Diagnosis not present

## 2016-03-10 DIAGNOSIS — R21 Rash and other nonspecific skin eruption: Secondary | ICD-10-CM | POA: Diagnosis present

## 2016-03-10 NOTE — ED Notes (Addendum)
Patient has a red raised rash on bilateral arm and hips.  Patient also has areas on his back.  Patient states that the reason he called EMS  Patient states that he has eaten an entire "bag of walnuts" the past 3 days. Denies any new detergents or allergies that he is aware of.  Patient denies chest pain, denies SOB, denies pain.

## 2016-03-10 NOTE — ED Provider Notes (Signed)
[facility] DEPT Provider Note   CSN: [id number] Arrival date & time: 03/10/16  2337 By signing my name below, I, Dyke Brackett, attest that this documentation has been prepared under the direction and in the presence of non-physician practitioner, Antonietta Breach, PA-C Electronically Signed: Dyke Brackett, Scribe. 03/11/2016. 12:03 AM.    History   Chief Complaint Chief Complaint  Patient presents with  . Allergic Reaction  . Dizziness   HPI Julian Porter is a 65 y.o. male who presents to the Emergency Department complaining of progressively worsening rash to bilateral arms, legs, buttocks, and upper neck onset yesterday. Per pt, rash began on his buttock last night but significantly worsened tonight at 8 pm. He notes associated itching, swelling, erythema, and lightheadedness. He has applied alcohol to the area with no relief; pt was administered 2 25 mg benadryl tablets by EMS PTA with relief of itching. No new soaps, lotions, detergents, foods, animals, plants, or medications. He states he has been eating walnuts every night for the past 3 nights, but has never had reactions to nuts before. Pt denies any fever, SOB, difficulty breathing or throat swelling.  The history is provided by the patient. No language interpreter was used.    Past Medical History:  Diagnosis Date  . Arthritis    OA  . Elevated cholesterol   . History of right bundle branch block   . Hypertension     Patient Active Problem List   Diagnosis Date Noted  . Obese 01/28/2016  . S/P left THA, AA 01/27/2016    Past Surgical History:  Procedure Laterality Date  . EYE STY REMOVED  2 1/2 YRS AGO  . INGROWN HAIR REMOVED ON HAIR LINE  4 YRS AGO  . TOTAL HIP ARTHROPLASTY Left 01/27/2016   Procedure: LEFT TOTAL HIP ARTHROPLASTY ANTERIOR APPROACH;  Surgeon: Paralee Cancel, MD;  Location: [facility];  Service: Orthopedics;  Laterality: Left;     Home Medications    Prior to Admission medications   Medication Sig  Start Date End Date Taking? Authorizing Provider  amLODipine (NORVASC) 10 MG tablet Take 10 mg by mouth every evening. 12/30/15   Historical Provider, MD  diphenhydrAMINE (BENADRYL) 25 mg capsule Take 2 capsules (50 mg total) by mouth every 8 (eight) hours as needed for itching. 03/11/16   Antonietta Breach, PA-C  docusate sodium (COLACE) 100 MG capsule Take 1 capsule (100 mg total) by mouth 2 (two) times daily. 01/28/16   Danae Orleans, PA-C  EPINEPHrine (EPIPEN 2-PAK) 0.3 mg/0.3 mL IJ SOAJ injection Inject 0.3 mLs (0.3 mg total) into the muscle once as needed (for severe allergic reaction). CAll 911 immediately if you have to use this medicine 03/11/16   Antonietta Breach, PA-C  ferrous sulfate 325 (65 FE) MG tablet Take 1 tablet (325 mg total) by mouth 3 (three) times daily after meals. 01/28/16   Danae Orleans, PA-C  HYDROcodone-acetaminophen (NORCO) 7.5-325 MG tablet Take 1-2 tablets by mouth every 4 (four) hours as needed for moderate pain. 01/28/16   Danae Orleans, PA-C  lisinopril (PRINIVIL,ZESTRIL) 20 MG tablet Take 20 mg by mouth daily. 12/19/15   Historical Provider, MD  methocarbamol (ROBAXIN) 500 MG tablet Take 1 tablet (500 mg total) by mouth every 6 (six) hours as needed for muscle spasms. 01/28/16   Danae Orleans, PA-C  polyethylene glycol (MIRALAX / GLYCOLAX) packet Take 17 g by mouth 2 (two) times daily. 01/28/16   Danae Orleans, PA-C  predniSONE (DELTASONE) 20 MG tablet Take 2 tablets (40 mg  total) by mouth daily. Take 40 mg by mouth daily for 3 days, then 20mg  by mouth daily for 3 days, then 10mg  daily for 3 days 03/11/16   Antonietta Breach, PA-C  simvastatin (ZOCOR) 20 MG tablet Take 20 mg by mouth daily.  12/01/15   Historical Provider, MD    Family History No family history on file.  Social History Social History  Substance Use Topics  . Smoking status: Never Smoker  . Smokeless tobacco: Never Used  . Alcohol use Yes     Comment: [place] BEER    Allergies   Patient has no known  allergies.  Review of Systems Review of Systems 10 systems reviewed and all are negative for acute change except as noted in the HPI.    Physical Exam Updated Vital Signs BP 126/87 (BP Location: Left Arm)   Pulse 83   Temp 97.8 F (36.6 C) (Oral)   Resp 20   SpO2 96%   Physical Exam  Constitutional: He is oriented to person, place, and time. He appears well-developed and well-nourished. No distress.  HENT:  Head: Normocephalic and atraumatic.  Mouth/Throat: Oropharynx is clear and moist.  Oropharynx clear. No angioedema. Patient tolerating secretions without difficulty. No tripoding.  Eyes: Conjunctivae and EOM are normal. No scleral icterus.  Neck: Normal range of motion.  No stridor  Cardiovascular: Normal rate, regular rhythm and intact distal pulses.   Pulmonary/Chest: Effort normal. No respiratory distress. He has no wheezes. He has no rales.  Lungs clear to auscultation bilaterally. Chest expansion symmetric.  Musculoskeletal: Normal range of motion.  Neurological: He is alert and oriented to person, place, and time. He exhibits normal muscle tone. Coordination normal.  Ambulatory with steady gait.  Skin: Skin is warm and dry. Rash noted. He is not diaphoretic. No erythema. No pallor.  Urticaria noted to back of neck, bilateral elbows distally, and bilateral inner thighs. Scattered bacterial rash also noted on upper chest and axilla.  Psychiatric: He has a normal mood and affect. His behavior is normal.  Nursing note and vitals reviewed.   ED Treatments / Results  DIAGNOSTIC STUDIES:  Oxygen Saturation is 98% on RA, normal by my interpretation.    COORDINATION OF CARE:  12:02 AM Discussed treatment plan which includes Pepcid and solu-medrol with pt at bedside and pt agreed to plan.   Labs (all labs ordered are listed, but only abnormal results are displayed) Labs Reviewed - No data to display  EKG  EKG Interpretation None       Radiology No results  found.  Procedures Procedures (including critical care time)  Medications Ordered in ED Medications  sodium chloride 0.9 % bolus 1,000 mL (0 mLs Intravenous Stopped 03/11/16 0059)  methylPREDNISolone sodium succinate (SOLU-MEDROL) 125 mg/2 mL injection 125 mg (125 mg Intravenous Given 03/11/16 0029)  famotidine (PEPCID) IVPB 20 mg premix (0 mg Intravenous Stopped 03/11/16 0251)  diphenhydrAMINE (BENADRYL) injection 25 mg (25 mg Intravenous Given 03/11/16 0354)     Initial Impression / Assessment and Plan / ED Course  I have reviewed the triage vital signs and the nursing notes.  Pertinent labs & imaging results that were available during my care of the patient were reviewed by me and considered in my medical decision making (see chart for details).  Clinical Course     65 year old male presents to the emergency department for evaluation of a rash which is consistent with urticaria. No new topical contacts or ingestions. No recent medication changes. Symptoms have improved  with Benadryl, Pepcid, and Solu-Medrol. Patient denies any wheezing, inability to swallow, or shortness of breath. On reassessment, he reports that his rash has improved. He reports feeling comfortable with discharge and further outpatient management. Will place on prednisone taper. EpiPen given should symptoms severely worsen. Patient also referred to an allergist. Return precautions discussed and provided. Patient discharged in stable condition with no unaddressed concerns.  Vitals:   03/10/16 2321 03/11/16 0159 03/11/16 0355 03/11/16 0358  BP: 145/93 138/75 126/87   Pulse: 90 80 83   Resp: 18 20 20    Temp: 97.8 F (36.6 C) 97.7 F (36.5 C) 97.8 F (36.6 C)   TempSrc: Oral Oral Oral   SpO2: 98% 98% 96% 96%    Final Clinical Impressions(s) / ED Diagnoses   Final diagnoses:  Urticaria    New Prescriptions Discharge Medication List as of 03/11/2016  4:27 AM    START taking these medications   Details   diphenhydrAMINE (BENADRYL) 25 mg capsule Take 2 capsules (50 mg total) by mouth every 8 (eight) hours as needed for itching., Starting Thu 03/11/2016, Print    EPINEPHrine (EPIPEN 2-PAK) 0.3 mg/0.3 mL IJ SOAJ injection Inject 0.3 mLs (0.3 mg total) into the muscle once as needed (for severe allergic reaction). CAll 911 immediately if you have to use this medicine, Starting Thu 03/11/2016, Print    predniSONE (DELTASONE) 20 MG tablet Take 2 tablets (40 mg total) by mouth daily. Take 40 mg by mouth daily for 3 days, then 20mg  by mouth daily for 3 days, then 10mg  daily for 3 days, Starting Thu 03/11/2016, Print       I personally performed the services described in this documentation, which was scribed in my presence. The recorded information has been reviewed and is accurate.      Antonietta Breach, PA-C 03/11/16 [id number]    Orpah Greek, MD 03/11/16 [phone]

## 2016-03-10 NOTE — ED Triage Notes (Signed)
Per EMS patient "broke out" this morning and it the itching and raised areas subsided, patient states that the hives came back tonight.  Per EMS lungs clear.  Patient observed ambulating to triage room, talking in full sentences.  Patient was given oral benadryl at 1046.

## 2016-03-11 MED ORDER — PREDNISONE 20 MG PO TABS: 40.0000 mg | ORAL_TABLET | Freq: Every day | ORAL | 0 refills | Status: DC

## 2016-03-11 MED ORDER — DIPHENHYDRAMINE HCL 50 MG/ML IJ SOLN
25.0000 mg | Freq: Once | INTRAMUSCULAR | Status: AC
  Administered 2016-03-11: 25 mg via INTRAVENOUS
  Filled 2016-03-11: qty 1

## 2016-03-11 MED ORDER — EPINEPHRINE 0.3 MG/0.3ML IJ SOAJ: 0.3000 mg | Freq: Once | INTRAMUSCULAR | 1 refills | Status: DC | PRN

## 2016-03-11 MED ORDER — SODIUM CHLORIDE 0.9 % IV BOLUS (SEPSIS)
1000.0000 mL | Freq: Once | INTRAVENOUS | Status: AC
  Administered 2016-03-11: 1000 mL via INTRAVENOUS

## 2016-03-11 MED ORDER — METHYLPREDNISOLONE SODIUM SUCC 125 MG IJ SOLR
125.0000 mg | Freq: Once | INTRAMUSCULAR | Status: AC
Start: 2016-03-11 — End: 2016-03-11
  Administered 2016-03-11: 125 mg via INTRAVENOUS
  Filled 2016-03-11: qty 2

## 2016-03-11 MED ORDER — DIPHENHYDRAMINE HCL 25 MG PO CAPS: 50.0000 mg | ORAL_CAPSULE | Freq: Three times a day (TID) | ORAL | 0 refills | Status: DC | PRN

## 2016-03-11 MED ORDER — FAMOTIDINE IN NACL 20-0.9 MG/50ML-% IV SOLN
20.0000 mg | INTRAVENOUS | Status: AC
  Administered 2016-03-11: 20 mg via INTRAVENOUS
  Filled 2016-03-11: qty 50

## 2016-03-11 NOTE — ED Notes (Signed)
Patient was given 50mg  benadryl PO prior to arrival .

## 2016-03-11 NOTE — Discharge Instructions (Signed)
Take prednisone as prescribed until finished. Take Benadryl as needed for persistent rash or itching. Use an Epipen if you develop a severe allergic reaction (difficulty breathing, swallowing, rash with nausea, vomiting, lightheadedness, etc.). Follow up with your primary care doctor and/or an allergist.

## 2016-12-08 ENCOUNTER — Emergency Department (HOSPITAL_COMMUNITY)
Admission: EM | Admit: 2016-12-08 | Discharge: 2016-12-08 | Disposition: A | Discharge status: Home / Self Care | Payer: Medicare Other | Attending: Emergency Medicine | Admitting: Emergency Medicine

## 2016-12-08 ENCOUNTER — Encounter (HOSPITAL_COMMUNITY): Payer: Self-pay | Admitting: Emergency Medicine

## 2016-12-08 DIAGNOSIS — I10 Essential (primary) hypertension: Secondary | ICD-10-CM | POA: Diagnosis not present

## 2016-12-08 DIAGNOSIS — Z7982 Long term (current) use of aspirin: Secondary | ICD-10-CM | POA: Insufficient documentation

## 2016-12-08 DIAGNOSIS — Z96642 Presence of left artificial hip joint: Secondary | ICD-10-CM | POA: Diagnosis not present

## 2016-12-08 DIAGNOSIS — Z79899 Other long term (current) drug therapy: Secondary | ICD-10-CM | POA: Insufficient documentation

## 2016-12-08 DIAGNOSIS — R002 Palpitations: Secondary | ICD-10-CM

## 2016-12-08 LAB — CBC WITH DIFFERENTIAL/PLATELET
Basophils Absolute: 0 10*3/uL (ref 0.0–0.1)
Basophils Relative: 0 %
Eosinophils Absolute: 0.2 10*3/uL (ref 0.0–0.7)
Eosinophils Relative: 4 %
HCT: 39.4 % (ref 39.0–52.0)
Hemoglobin: 14.4 g/dL (ref 13.0–17.0)
Lymphocytes Relative: 49 %
Lymphs Abs: 2.5 10*3/uL (ref 0.7–4.0)
MCH: 30 pg (ref 26.0–34.0)
MCHC: 36.5 g/dL — ABNORMAL HIGH (ref 30.0–36.0)
MCV: 82.1 fL (ref 78.0–100.0)
Monocytes Absolute: 0.4 10*3/uL (ref 0.1–1.0)
Monocytes Relative: 9 %
Neutro Abs: 2 10*3/uL (ref 1.7–7.7)
Neutrophils Relative %: 38 %
Platelets: 181 10*3/uL (ref 150–400)
RBC: 4.8 MIL/uL (ref 4.22–5.81)
RDW: 12.6 % (ref 11.5–15.5)
WBC: 5.2 10*3/uL (ref 4.0–10.5)

## 2016-12-08 LAB — BASIC METABOLIC PANEL
Anion gap: 8 (ref 5–15)
BUN: 11 mg/dL (ref 6–20)
CO2: 27 mmol/L (ref 22–32)
Calcium: 9.3 mg/dL (ref 8.9–10.3)
Chloride: 103 mmol/L (ref 101–111)
Creatinine, Ser: 0.96 mg/dL (ref 0.61–1.24)
GFR calc Af Amer: >60 mL/min (ref >60)
GFR calc non Af Amer: >60 mL/min (ref >60)
Glucose, Bld: 100 mg/dL — ABNORMAL HIGH (ref 65–99)
Potassium: 4.2 mmol/L (ref 3.5–5.1)
Sodium: 138 mmol/L (ref 135–145)

## 2016-12-08 LAB — I-STAT TROPONIN, ED: TROPONIN I, POC: 0.01 ng/mL (ref 0.00–0.08)

## 2016-12-08 NOTE — ED Provider Notes (Signed)
[facility] DEPT Provider Note   CSN: [id number] Arrival date & time: 12/08/16  1531     History   Chief Complaint Chief Complaint  Patient presents with  . Palpitations    HPI Julian Porter is a 66 y.o. male.  HPI   69 YOM presents today with complaints of palpitations.  Patient notes that this morning he woke up at 8 AM.  He notes after awakening he has had several episodes of single palpitations in his left chest.  He notes these are not associated with any shortness of breath, dizziness, chest pain, nausea or vomiting.  He denies any other significant complaints.  Patient is asymptomatic throughout stay here in the ED. patient reports that he has seen Dr. Wynonia Lawman for pre op clearance but no diagnosed cardiac problems.  Patient denies any drug or alcohol use, reports that he has had a little more stress in her life lately.  Past Medical History:  Diagnosis Date  . Arthritis    OA  . Elevated cholesterol   . History of right bundle branch block   . Hypertension     Patient Active Problem List   Diagnosis Date Noted  . Obese 01/28/2016  . S/P left THA, AA 01/27/2016    Past Surgical History:  Procedure Laterality Date  . EYE STY REMOVED  2 1/2 YRS AGO  . INGROWN HAIR REMOVED ON HAIR LINE  4 YRS AGO  . TOTAL HIP ARTHROPLASTY Left 01/27/2016   Procedure: LEFT TOTAL HIP ARTHROPLASTY ANTERIOR APPROACH;  Surgeon: Paralee Cancel, MD;  Location: [facility];  Service: Orthopedics;  Laterality: Left;       Home Medications    Prior to Admission medications   Medication Sig Start Date End Date Taking? Authorizing Provider  amLODipine (NORVASC) 10 MG tablet Take 10 mg by mouth every evening. 12/30/15  Yes [provider]  aspirin EC 81 MG tablet Take 81 mg by mouth daily.   Yes [provider]  EPINEPHrine (EPIPEN 2-PAK) 0.3 mg/0.3 mL IJ SOAJ injection Inject 0.3 mLs (0.3 mg total) into the muscle once as needed (for severe allergic reaction). CAll 911  immediately if you have to use this medicine 03/11/16  Yes Antonietta Breach, PA-C  losartan (COZAAR) 50 MG tablet Take 50 mg by mouth daily.   Yes [provider]  sertraline (ZOLOFT) 50 MG tablet Take 50 mg by mouth every evening. 12/03/16  Yes [provider]  simvastatin (ZOCOR) 20 MG tablet Take 20 mg by mouth every evening.  12/01/15  Yes [provider]  diphenhydrAMINE (BENADRYL) 25 mg capsule Take 2 capsules (50 mg total) by mouth every 8 (eight) hours as needed for itching. Patient not taking: Reported on 12/08/2016 03/11/16   Antonietta Breach, PA-C  docusate sodium (COLACE) 100 MG capsule Take 1 capsule (100 mg total) by mouth 2 (two) times daily. Patient not taking: Reported on 12/08/2016 01/28/16   Danae Orleans, PA-C  ferrous sulfate 325 (65 FE) MG tablet Take 1 tablet (325 mg total) by mouth 3 (three) times daily after meals. Patient not taking: Reported on 12/08/2016 01/28/16   Danae Orleans, PA-C  HYDROcodone-acetaminophen (NORCO) 7.5-325 MG tablet Take 1-2 tablets by mouth every 4 (four) hours as needed for moderate pain. Patient not taking: Reported on 12/08/2016 01/28/16   Danae Orleans, PA-C  methocarbamol (ROBAXIN) 500 MG tablet Take 1 tablet (500 mg total) by mouth every 6 (six) hours as needed for muscle spasms. Patient not taking: Reported on 12/08/2016 01/28/16  Danae Orleans, PA-C  polyethylene glycol (MIRALAX / GLYCOLAX) packet Take 17 g by mouth 2 (two) times daily. Patient not taking: Reported on 12/08/2016 01/28/16   Danae Orleans, PA-C  predniSONE (DELTASONE) 20 MG tablet Take 2 tablets (40 mg total) by mouth daily. Take 40 mg by mouth daily for 3 days, then 20mg  by mouth daily for 3 days, then 10mg  daily for 3 days Patient not taking: Reported on 12/08/2016 03/11/16   Antonietta Breach, PA-C    Family History History reviewed. No pertinent family history.  Social History Social History  Substance Use Topics  . Smoking status: Never Smoker  .  Smokeless tobacco: Never Used  . Alcohol use Yes     Comment: [place] BEER     Allergies   Patient has no known allergies.   Review of Systems Review of Systems  All other systems reviewed and are negative.    Physical Exam Updated Vital Signs BP (!) 170/103   Pulse 72   Temp 98.7 F (37.1 C) (Oral)   Resp 16   Ht 6' (1.829 m)   Wt 129.3 kg (285 lb)   SpO2 98%   BMI 38.65 kg/m   Physical Exam  Constitutional: He is oriented to person, place, and time. He appears well-developed and well-nourished.  HENT:  Head: Normocephalic and atraumatic.  Eyes: Pupils are equal, round, and reactive to light. Conjunctivae are normal. Right eye exhibits no discharge. Left eye exhibits no discharge. No scleral icterus.  Neck: Normal range of motion. No JVD present. No tracheal deviation present.  Cardiovascular: Normal rate, regular rhythm, normal heart sounds and intact distal pulses.  Exam reveals no gallop and no friction rub.   No murmur heard. Pulmonary/Chest: Effort normal and breath sounds normal. No stridor. No respiratory distress. He has no wheezes. He has no rales. He exhibits no tenderness.  Musculoskeletal: Normal range of motion. He exhibits no edema.  Neurological: He is alert and oriented to person, place, and time. Coordination normal.  Psychiatric: He has a normal mood and affect. His behavior is normal. Judgment and thought content normal.  Nursing note and vitals reviewed.    ED Treatments / Results  Labs (all labs ordered are listed, but only abnormal results are displayed) Labs Reviewed  CBC WITH DIFFERENTIAL/PLATELET - Abnormal; Notable for the following:       Result Value   MCHC 36.5 (*)    All other components within normal limits  BASIC METABOLIC PANEL - Abnormal; Notable for the following:    Glucose, Bld 100 (*)    All other components within normal limits  I-STAT TROPONIN, ED  I-STAT TROPONIN, ED    EKG  EKG Interpretation None        Radiology No results found.  Procedures Procedures (including critical care time)  Medications Ordered in ED Medications - No data to display   Initial Impression / Assessment and Plan / ED Course  I have reviewed the triage vital signs and the nursing notes.  Pertinent labs & imaging results that were available during my care of the patient were reviewed by me and considered in my medical decision making (see chart for details).    66 year old male presents today with palpitations.  He has normal workup here with no acute findings.  He is asymptomatic throughout his evaluation.  I have very low suspicion for acute life-threatening etiology in this well-appearing patient.  He will follow-up as an outpatient with cardiology return immediately with any new or  worsening signs or symptoms.  He verbalized understanding and agreement to today's plan had no further questions or concerns the time discharge.  Final Clinical Impressions(s) / ED Diagnoses   Final diagnoses:  Palpitations    New Prescriptions Discharge Medication List as of 12/08/2016  7:05 PM       Okey Regal, PA-C 12/08/16 Laverna Peace, MD 12/09/16 2332

## 2016-12-08 NOTE — ED Triage Notes (Addendum)
Pt c/o intermittent palpations at site of left lower chest occurring every 7-8 seconds and lasting about 0.5 seconds. No chest pain or pressure, SOB, dizziness, blurred vision, nausea. Pt reports gaining large amount of weight since hip surgery in October.  Occasional premature cardiac contraction noted with auscultation and palpation of pulse, correlates with PVC on monitor. Asymptomatic.

## 2016-12-08 NOTE — Discharge Instructions (Signed)
Please read attached information. If you experience any new or worsening signs or symptoms please return to the emergency room for evaluation. Please follow-up with your primary care provider or specialist as discussed.  °

## 2016-12-08 NOTE — ED Notes (Signed)
Bed: [facility] Expected date:  Expected time:  Means of arrival:  Comments: 

## 2016-12-09 DIAGNOSIS — R002 Palpitations: Secondary | ICD-10-CM | POA: Diagnosis not present

## 2016-12-09 DIAGNOSIS — Z01810 Encounter for preprocedural cardiovascular examination: Secondary | ICD-10-CM | POA: Diagnosis not present

## 2016-12-09 DIAGNOSIS — I10 Essential (primary) hypertension: Secondary | ICD-10-CM | POA: Diagnosis not present

## 2016-12-09 DIAGNOSIS — E668 Other obesity: Secondary | ICD-10-CM | POA: Diagnosis not present

## 2016-12-09 DIAGNOSIS — I119 Hypertensive heart disease without heart failure: Secondary | ICD-10-CM | POA: Diagnosis not present

## 2016-12-09 DIAGNOSIS — I4510 Unspecified right bundle-branch block: Secondary | ICD-10-CM | POA: Diagnosis not present

## 2016-12-10 DIAGNOSIS — E668 Other obesity: Secondary | ICD-10-CM | POA: Diagnosis not present

## 2016-12-10 DIAGNOSIS — I4510 Unspecified right bundle-branch block: Secondary | ICD-10-CM | POA: Diagnosis not present

## 2016-12-10 DIAGNOSIS — R002 Palpitations: Secondary | ICD-10-CM | POA: Diagnosis not present

## 2016-12-10 DIAGNOSIS — I119 Hypertensive heart disease without heart failure: Secondary | ICD-10-CM | POA: Diagnosis not present

## 2016-12-11 DIAGNOSIS — R002 Palpitations: Secondary | ICD-10-CM | POA: Diagnosis not present

## 2016-12-17 DIAGNOSIS — R079 Chest pain, unspecified: Secondary | ICD-10-CM | POA: Diagnosis not present

## 2017-01-03 DIAGNOSIS — H25813 Combined forms of age-related cataract, bilateral: Secondary | ICD-10-CM | POA: Diagnosis not present

## 2017-01-03 DIAGNOSIS — H43813 Vitreous degeneration, bilateral: Secondary | ICD-10-CM | POA: Diagnosis not present

## 2017-01-03 DIAGNOSIS — H18413 Arcus senilis, bilateral: Secondary | ICD-10-CM | POA: Diagnosis not present

## 2017-01-03 DIAGNOSIS — H35372 Puckering of macula, left eye: Secondary | ICD-10-CM | POA: Diagnosis not present

## 2017-01-06 DIAGNOSIS — Z23 Encounter for immunization: Secondary | ICD-10-CM | POA: Diagnosis not present

## 2017-01-14 DIAGNOSIS — R079 Chest pain, unspecified: Secondary | ICD-10-CM | POA: Diagnosis not present

## 2017-01-14 DIAGNOSIS — I4510 Unspecified right bundle-branch block: Secondary | ICD-10-CM | POA: Diagnosis not present

## 2017-01-14 DIAGNOSIS — E668 Other obesity: Secondary | ICD-10-CM | POA: Diagnosis not present

## 2017-01-14 DIAGNOSIS — R002 Palpitations: Secondary | ICD-10-CM | POA: Diagnosis not present

## 2017-01-14 DIAGNOSIS — I119 Hypertensive heart disease without heart failure: Secondary | ICD-10-CM | POA: Diagnosis not present

## 2017-01-20 DIAGNOSIS — F329 Major depressive disorder, single episode, unspecified: Secondary | ICD-10-CM | POA: Diagnosis not present

## 2017-01-20 DIAGNOSIS — E7800 Pure hypercholesterolemia, unspecified: Secondary | ICD-10-CM | POA: Diagnosis not present

## 2017-01-20 DIAGNOSIS — R7303 Prediabetes: Secondary | ICD-10-CM | POA: Diagnosis not present

## 2017-01-20 DIAGNOSIS — I10 Essential (primary) hypertension: Secondary | ICD-10-CM | POA: Diagnosis not present

## 2017-02-02 DIAGNOSIS — E7800 Pure hypercholesterolemia, unspecified: Secondary | ICD-10-CM | POA: Diagnosis not present

## 2017-02-10 DIAGNOSIS — R945 Abnormal results of liver function studies: Secondary | ICD-10-CM | POA: Diagnosis not present

## 2017-02-10 DIAGNOSIS — K760 Fatty (change of) liver, not elsewhere classified: Secondary | ICD-10-CM | POA: Diagnosis not present

## 2017-03-14 DIAGNOSIS — R740 Nonspecific elevation of levels of transaminase and lactic acid dehydrogenase [LDH]: Secondary | ICD-10-CM | POA: Diagnosis not present

## 2017-03-14 DIAGNOSIS — K760 Fatty (change of) liver, not elsewhere classified: Secondary | ICD-10-CM | POA: Diagnosis not present

## 2017-04-01 DIAGNOSIS — I10 Essential (primary) hypertension: Secondary | ICD-10-CM | POA: Diagnosis not present

## 2017-04-01 DIAGNOSIS — E785 Hyperlipidemia, unspecified: Secondary | ICD-10-CM | POA: Diagnosis not present

## 2017-04-01 DIAGNOSIS — Z125 Encounter for screening for malignant neoplasm of prostate: Secondary | ICD-10-CM | POA: Diagnosis not present

## 2017-04-01 DIAGNOSIS — Z0000 Encounter for general adult medical examination without abnormal findings: Secondary | ICD-10-CM | POA: Diagnosis not present

## 2017-04-08 DIAGNOSIS — F411 Generalized anxiety disorder: Secondary | ICD-10-CM | POA: Diagnosis not present

## 2017-04-08 DIAGNOSIS — E119 Type 2 diabetes mellitus without complications: Secondary | ICD-10-CM | POA: Diagnosis not present

## 2017-04-08 DIAGNOSIS — Z6838 Body mass index (BMI) 38.0-38.9, adult: Secondary | ICD-10-CM | POA: Diagnosis not present

## 2017-04-08 DIAGNOSIS — L509 Urticaria, unspecified: Secondary | ICD-10-CM | POA: Diagnosis not present

## 2017-04-08 DIAGNOSIS — R740 Nonspecific elevation of levels of transaminase and lactic acid dehydrogenase [LDH]: Secondary | ICD-10-CM | POA: Diagnosis not present

## 2017-04-08 DIAGNOSIS — E785 Hyperlipidemia, unspecified: Secondary | ICD-10-CM | POA: Diagnosis not present

## 2017-04-08 DIAGNOSIS — I10 Essential (primary) hypertension: Secondary | ICD-10-CM | POA: Diagnosis not present

## 2017-04-08 DIAGNOSIS — Z7982 Long term (current) use of aspirin: Secondary | ICD-10-CM | POA: Diagnosis not present

## 2017-04-08 DIAGNOSIS — M6208 Separation of muscle (nontraumatic), other site: Secondary | ICD-10-CM | POA: Diagnosis not present

## 2017-04-08 DIAGNOSIS — D72819 Decreased white blood cell count, unspecified: Secondary | ICD-10-CM | POA: Diagnosis not present

## 2017-04-20 DIAGNOSIS — Z1212 Encounter for screening for malignant neoplasm of rectum: Secondary | ICD-10-CM | POA: Diagnosis not present

## 2017-04-20 DIAGNOSIS — Z1211 Encounter for screening for malignant neoplasm of colon: Secondary | ICD-10-CM | POA: Diagnosis not present

## 2017-05-10 DIAGNOSIS — E785 Hyperlipidemia, unspecified: Secondary | ICD-10-CM | POA: Diagnosis not present

## 2017-05-10 DIAGNOSIS — E119 Type 2 diabetes mellitus without complications: Secondary | ICD-10-CM | POA: Diagnosis not present

## 2017-05-10 DIAGNOSIS — I10 Essential (primary) hypertension: Secondary | ICD-10-CM | POA: Diagnosis not present

## 2017-07-16 IMAGING — DX DG HIP (WITH OR WITHOUT PELVIS) 1V PORT*L*
3 series · 3 of 3 positions shown · non-contrast
Comparison: None.

CLINICAL DATA: Status post left total hip arthroplasty.

EXAM:
DG HIP (WITH OR WITHOUT PELVIS) 1V PORT LEFT

[pelvis ap]
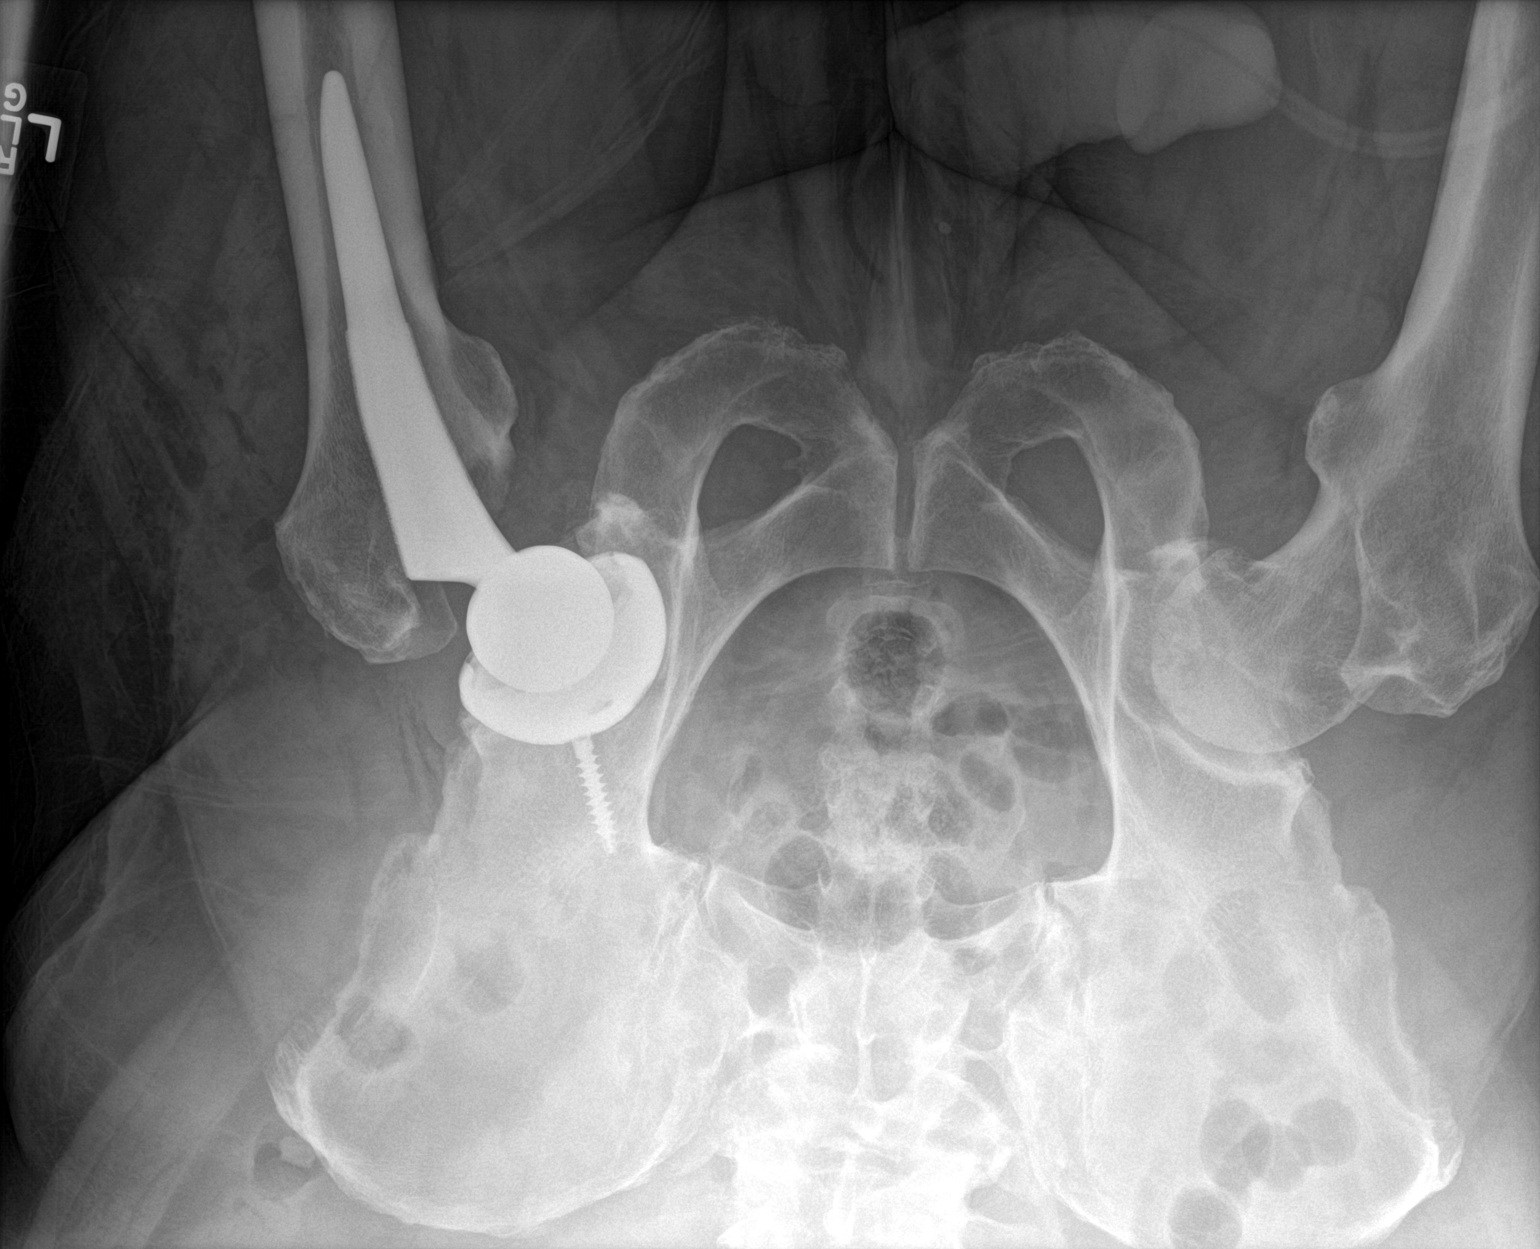

[hip lat (1 of 2)]
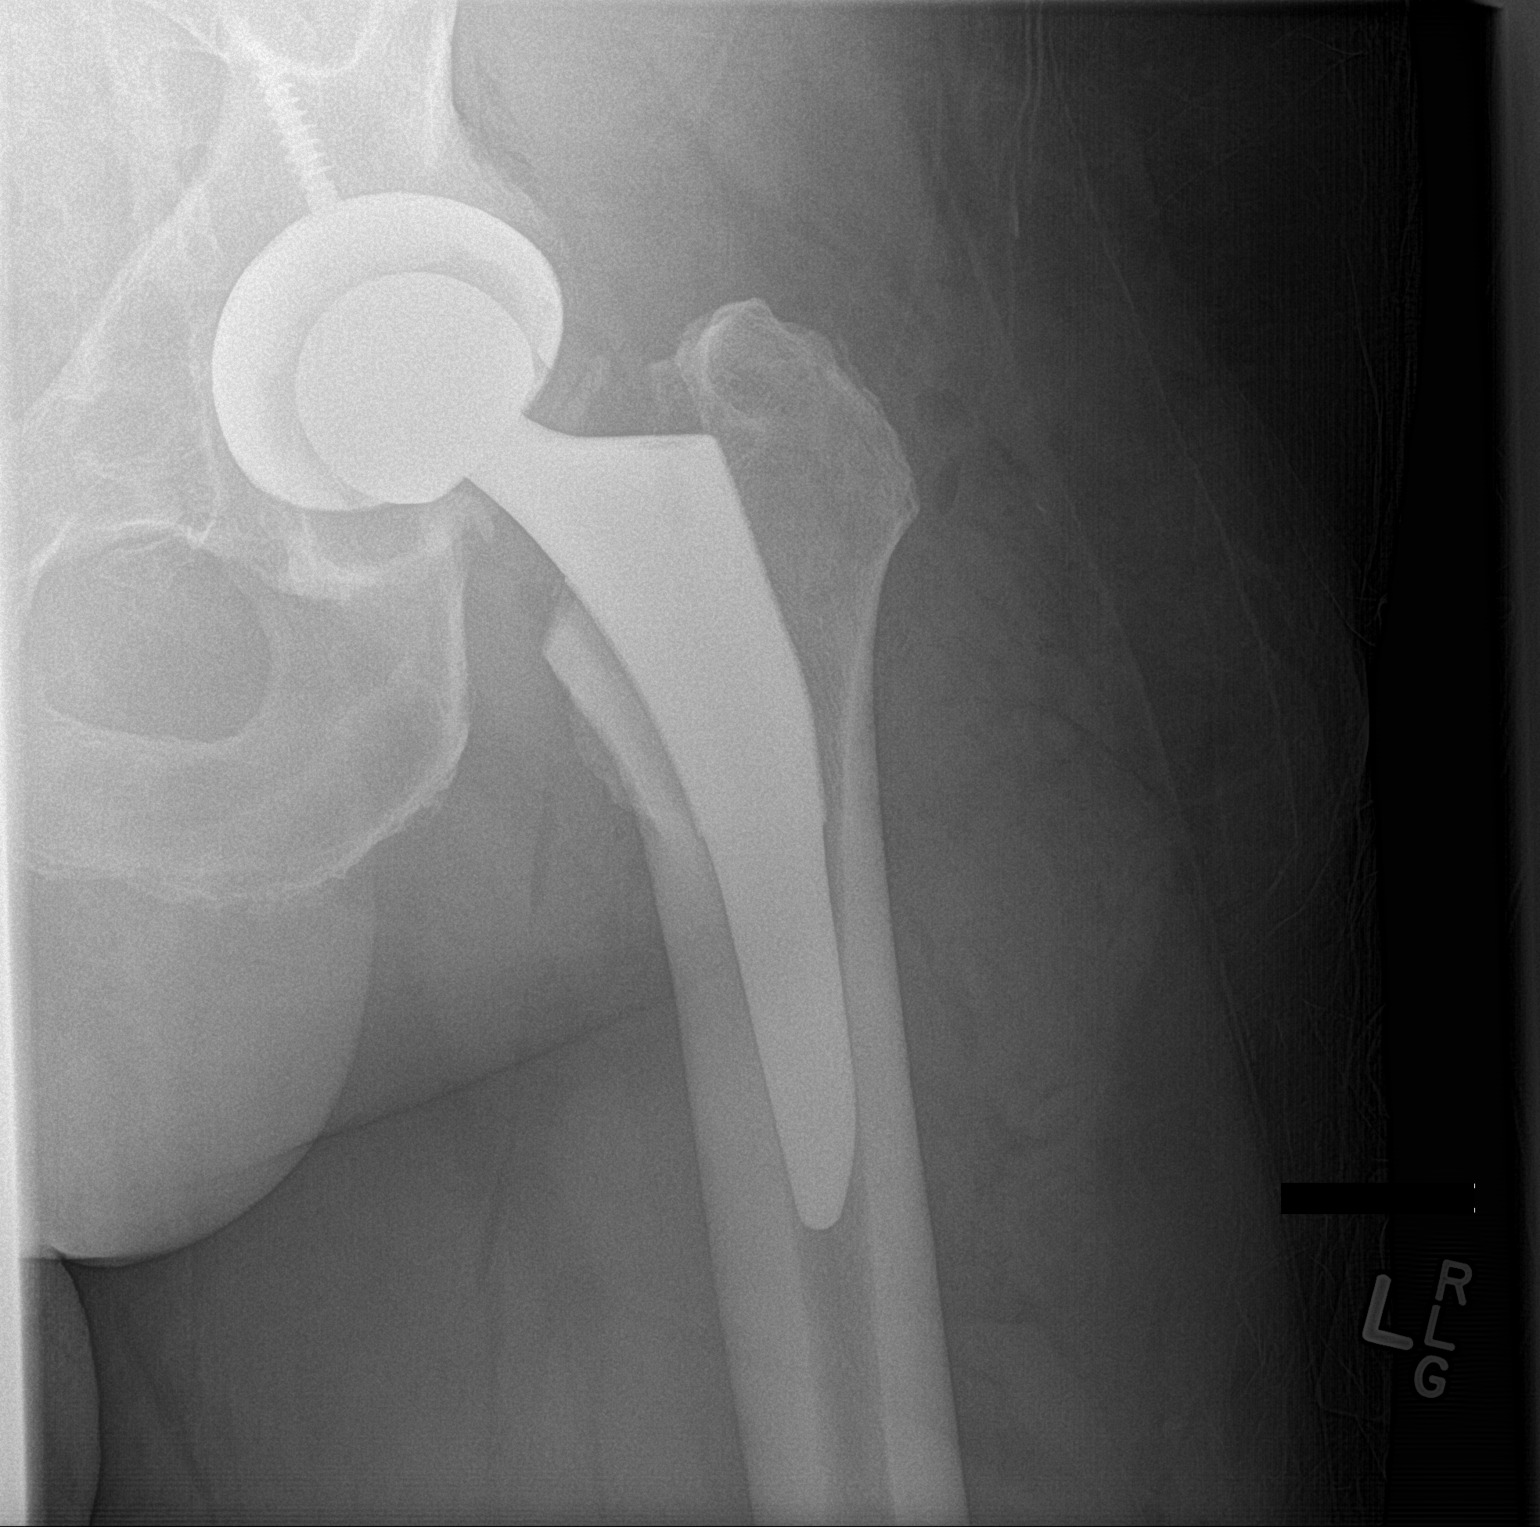

[hip lat (2 of 2)]
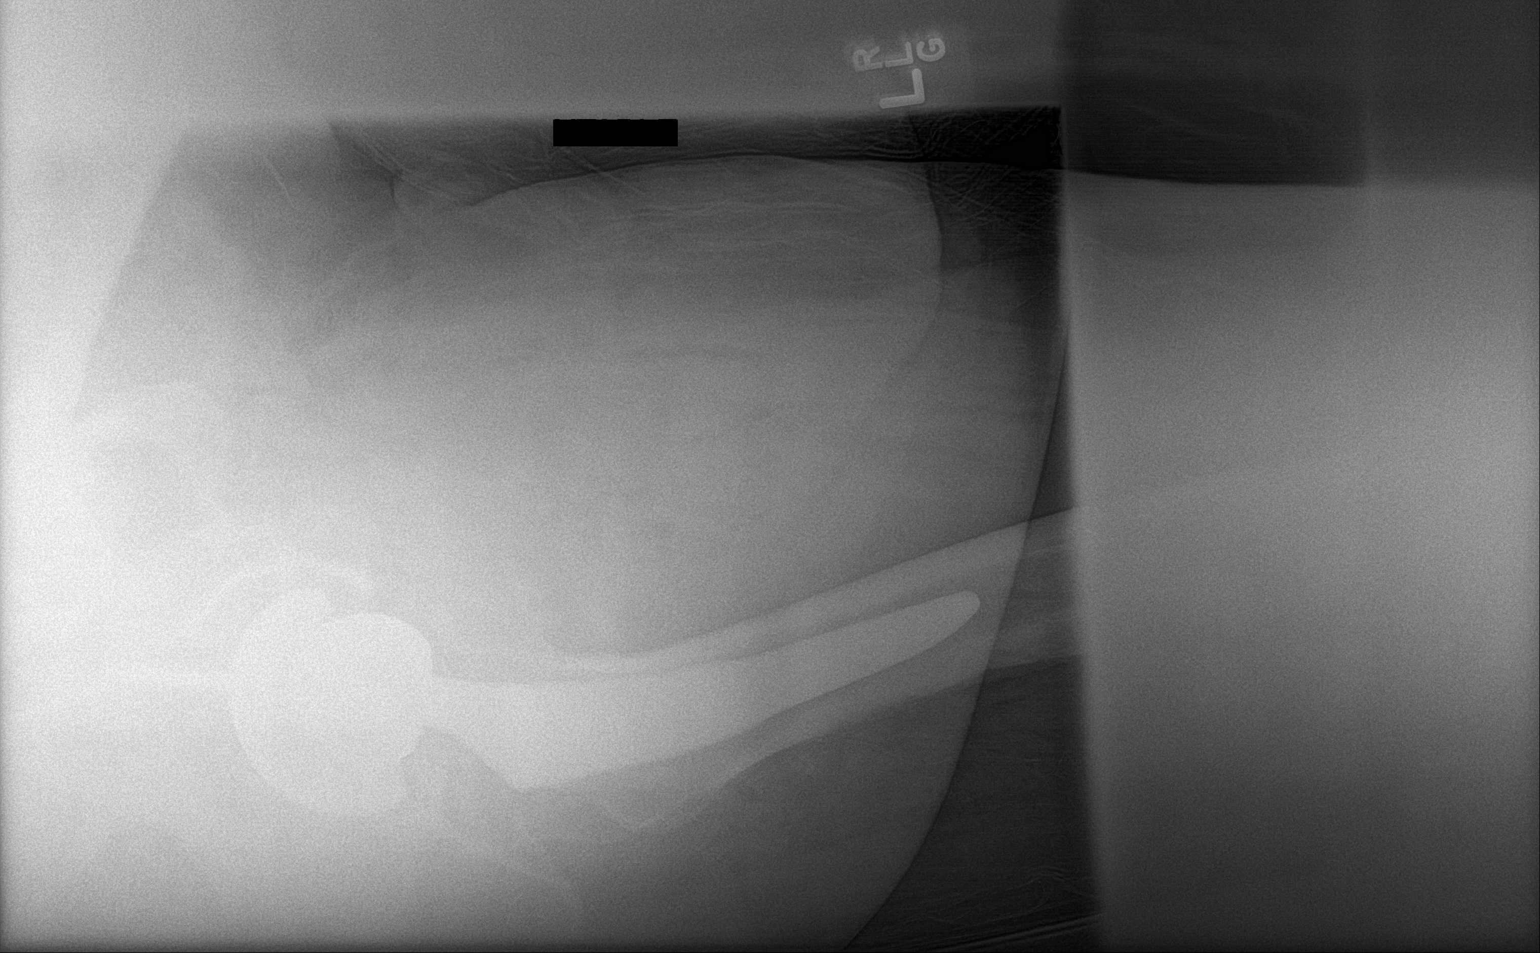

[3 of 3 positions shown; findings below may reference images not displayed]

FINDINGS: Left shoulder scratch set hardware components of a left hip
arthroplasty device are identified. No dislocation or periprosthetic
fracture identified. No complications. Moderate degenerative changes
are identified involving the right hip.
IMPRESSION: 1. Status post left hip arthroplasty.  No complications.

## 2017-08-08 DIAGNOSIS — Z6839 Body mass index (BMI) 39.0-39.9, adult: Secondary | ICD-10-CM | POA: Diagnosis not present

## 2017-08-08 DIAGNOSIS — E119 Type 2 diabetes mellitus without complications: Secondary | ICD-10-CM | POA: Diagnosis not present

## 2017-09-09 DIAGNOSIS — K760 Fatty (change of) liver, not elsewhere classified: Secondary | ICD-10-CM | POA: Diagnosis not present

## 2017-09-14 DIAGNOSIS — K760 Fatty (change of) liver, not elsewhere classified: Secondary | ICD-10-CM | POA: Diagnosis not present

## 2017-09-14 DIAGNOSIS — R945 Abnormal results of liver function studies: Secondary | ICD-10-CM | POA: Diagnosis not present

## 2017-11-30 DIAGNOSIS — I10 Essential (primary) hypertension: Secondary | ICD-10-CM | POA: Diagnosis not present

## 2017-11-30 DIAGNOSIS — F411 Generalized anxiety disorder: Secondary | ICD-10-CM | POA: Diagnosis not present

## 2017-11-30 DIAGNOSIS — E119 Type 2 diabetes mellitus without complications: Secondary | ICD-10-CM | POA: Diagnosis not present

## 2017-12-05 DIAGNOSIS — E119 Type 2 diabetes mellitus without complications: Secondary | ICD-10-CM | POA: Diagnosis not present

## 2017-12-05 DIAGNOSIS — I10 Essential (primary) hypertension: Secondary | ICD-10-CM | POA: Diagnosis not present

## 2017-12-15 DIAGNOSIS — H35372 Puckering of macula, left eye: Secondary | ICD-10-CM | POA: Diagnosis not present

## 2017-12-15 DIAGNOSIS — H25013 Cortical age-related cataract, bilateral: Secondary | ICD-10-CM | POA: Diagnosis not present

## 2017-12-15 DIAGNOSIS — H2513 Age-related nuclear cataract, bilateral: Secondary | ICD-10-CM | POA: Diagnosis not present

## 2017-12-15 DIAGNOSIS — H35363 Drusen (degenerative) of macula, bilateral: Secondary | ICD-10-CM | POA: Diagnosis not present

## 2017-12-15 DIAGNOSIS — H40013 Open angle with borderline findings, low risk, bilateral: Secondary | ICD-10-CM | POA: Diagnosis not present

## 2018-02-23 DIAGNOSIS — E119 Type 2 diabetes mellitus without complications: Secondary | ICD-10-CM | POA: Diagnosis not present

## 2018-02-23 DIAGNOSIS — I10 Essential (primary) hypertension: Secondary | ICD-10-CM | POA: Diagnosis not present

## 2018-03-06 DIAGNOSIS — E119 Type 2 diabetes mellitus without complications: Secondary | ICD-10-CM | POA: Diagnosis not present

## 2018-03-06 DIAGNOSIS — I10 Essential (primary) hypertension: Secondary | ICD-10-CM | POA: Diagnosis not present

## 2018-03-06 DIAGNOSIS — D487 Neoplasm of uncertain behavior of other specified sites: Secondary | ICD-10-CM | POA: Diagnosis not present

## 2018-03-22 ENCOUNTER — Encounter (HOSPITAL_COMMUNITY): Payer: Self-pay | Admitting: *Deleted

## 2018-03-22 ENCOUNTER — Emergency Department (HOSPITAL_COMMUNITY)
Admission: EM | Admit: 2018-03-22 | Discharge: 2018-03-22 | Disposition: A | Discharge status: Home / Self Care | Payer: Medicare Other | Attending: Emergency Medicine | Admitting: Emergency Medicine

## 2018-03-22 DIAGNOSIS — I10 Essential (primary) hypertension: Secondary | ICD-10-CM | POA: Insufficient documentation

## 2018-03-22 DIAGNOSIS — L539 Erythematous condition, unspecified: Secondary | ICD-10-CM | POA: Diagnosis present

## 2018-03-22 DIAGNOSIS — L089 Local infection of the skin and subcutaneous tissue, unspecified: Secondary | ICD-10-CM

## 2018-03-22 DIAGNOSIS — L729 Follicular cyst of the skin and subcutaneous tissue, unspecified: Secondary | ICD-10-CM | POA: Insufficient documentation

## 2018-03-22 MED ORDER — DOXYCYCLINE HYCLATE 100 MG PO CAPS: 100.0000 mg | ORAL_CAPSULE | Freq: Two times a day (BID) | ORAL | 0 refills | Status: DC

## 2018-03-22 MED ORDER — TRAMADOL HCL 50 MG PO TABS: 50.0000 mg | ORAL_TABLET | Freq: Four times a day (QID) | ORAL | 0 refills | Status: DC | PRN

## 2018-03-22 NOTE — ED Triage Notes (Signed)
Pt complains of bump on the back of his neck for the past month. Pt has appointment next Monday to see surgeon but states it has gotten bigger and more painful over the past couple of days.

## 2018-03-22 NOTE — ED Provider Notes (Signed)
[facility] DEPT Provider Note   CSN: [id number] Arrival date & time: 03/22/18  3220     History   Chief Complaint Chief Complaint  Patient presents with  . Abscess    HPI Julian Porter is a 67 y.o. male.  HPI Patient presents to the emergency department with a area on the posterior neck that is swollen over the last couple months.  He states he had a similar issue many years ago and had it excised by a Psychologist, sport and exercise.  He states that he feels like his got infected.  The patient states he does have an appointment to see his surgeon on Monday.  Patient still notes that it felt like got more painful over the last 2 days.  Patient denies any fevers, nausea, vomiting, numbness, weakness or headache. Past Medical History:  Diagnosis Date  . Arthritis    OA  . Elevated cholesterol   . History of right bundle branch block   . Hypertension     Patient Active Problem List   Diagnosis Date Noted  . Obese 01/28/2016  . S/P left THA, AA 01/27/2016    Past Surgical History:  Procedure Laterality Date  . EYE STY REMOVED  2 1/2 YRS AGO  . INGROWN HAIR REMOVED ON HAIR LINE  4 YRS AGO  . TOTAL HIP ARTHROPLASTY Left 01/27/2016   Procedure: LEFT TOTAL HIP ARTHROPLASTY ANTERIOR APPROACH;  Surgeon: Paralee Cancel, MD;  Location: [facility];  Service: Orthopedics;  Laterality: Left;        Home Medications    Prior to Admission medications   Medication Sig Start Date End Date Taking? Authorizing Provider  amLODipine (NORVASC) 10 MG tablet Take 10 mg by mouth every evening. 12/30/15   [provider]  aspirin EC 81 MG tablet Take 81 mg by mouth daily.    [provider]  diphenhydrAMINE (BENADRYL) 25 mg capsule Take 2 capsules (50 mg total) by mouth every 8 (eight) hours as needed for itching. Patient not taking: Reported on 12/08/2016 03/11/16   Antonietta Breach, PA-C  docusate sodium (COLACE) 100 MG capsule Take 1 capsule (100 mg total) by mouth 2 (two)  times daily. Patient not taking: Reported on 12/08/2016 01/28/16   Danae Orleans, PA-C  EPINEPHrine (EPIPEN 2-PAK) 0.3 mg/0.3 mL IJ SOAJ injection Inject 0.3 mLs (0.3 mg total) into the muscle once as needed (for severe allergic reaction). CAll 911 immediately if you have to use this medicine 03/11/16   Antonietta Breach, PA-C  ferrous sulfate 325 (65 FE) MG tablet Take 1 tablet (325 mg total) by mouth 3 (three) times daily after meals. Patient not taking: Reported on 12/08/2016 01/28/16   Danae Orleans, PA-C  HYDROcodone-acetaminophen (NORCO) 7.5-325 MG tablet Take 1-2 tablets by mouth every 4 (four) hours as needed for moderate pain. Patient not taking: Reported on 12/08/2016 01/28/16   Danae Orleans, PA-C  losartan (COZAAR) 50 MG tablet Take 50 mg by mouth daily.    [provider]  methocarbamol (ROBAXIN) 500 MG tablet Take 1 tablet (500 mg total) by mouth every 6 (six) hours as needed for muscle spasms. Patient not taking: Reported on 12/08/2016 01/28/16   Danae Orleans, PA-C  polyethylene glycol (MIRALAX / Floria Raveling) packet Take 17 g by mouth 2 (two) times daily. Patient not taking: Reported on 12/08/2016 01/28/16   Danae Orleans, PA-C  predniSONE (DELTASONE) 20 MG tablet Take 2 tablets (40 mg total) by mouth daily. Take 40 mg by mouth daily for 3 days,  then 20mg  by mouth daily for 3 days, then 10mg  daily for 3 days Patient not taking: Reported on 12/08/2016 03/11/16   Antonietta Breach, PA-C  sertraline (ZOLOFT) 50 MG tablet Take 50 mg by mouth every evening. 12/03/16   [provider]  simvastatin (ZOCOR) 20 MG tablet Take 20 mg by mouth every evening.  12/01/15   [provider]    Family History No family history on file.  Social History Social History   Tobacco Use  . Smoking status: Never Smoker  . Smokeless tobacco: Never Used  Substance Use Topics  . Alcohol use: Yes    Comment: OCC BEER  . Drug use: No     Allergies   Patient has no known  allergies.   Review of Systems Review of Systems All other systems negative except as documented in the HPI. All pertinent positives and negatives as reviewed in the HPI.  Physical Exam Updated Vital Signs BP (!) 168/82 (BP Location: Left Arm)   Pulse 79   Temp 98.5 F (36.9 C) (Oral)   Resp 18   SpO2 95%   Physical Exam  Constitutional: He is oriented to person, place, and time. He appears well-developed and well-nourished. No distress.  HENT:  Head: Normocephalic and atraumatic.  Eyes: Pupils are equal, round, and reactive to light.  Neck:    Pulmonary/Chest: Effort normal.  Neurological: He is alert and oriented to person, place, and time.  Skin: Skin is warm and dry.  Psychiatric: He has a normal mood and affect.  Nursing note and vitals reviewed.    ED Treatments / Results  Labs (all labs ordered are listed, but only abnormal results are displayed) Labs Reviewed - No data to display  EKG None  Radiology No results found.  Procedures Procedures (including critical care time)  Medications Ordered in ED Medications - No data to display   Initial Impression / Assessment and Plan / ED Course  I have reviewed the triage vital signs and the nursing notes.  Pertinent labs & imaging results that were available during my care of the patient were reviewed by me and considered in my medical decision making (see chart for details).     I did discuss with the patient that since he has a surgery appointment that I feel that this would be appropriate to follow-up with them for more definitive care.  I did offer him antibiotics and will told him to use warm compresses.  Patient agrees to the follow-up.  He also agrees that seeing the surgeon would probably be the best course of action.  I told him to return for any worsening in his condition. Final Clinical Impressions(s) / ED Diagnoses   Final diagnoses:  None    ED Discharge Orders    None       Dalia Heading, PA-C 03/26/18 1608    Milton Ferguson, MD 03/29/18 [phone]

## 2018-03-22 NOTE — Discharge Instructions (Addendum)
Return here as needed.  Follow-up with the surgeon on Monday.  Use warm compresses over the area.

## 2018-03-27 ENCOUNTER — Ambulatory Visit: Payer: Self-pay | Admitting: Surgery

## 2018-03-27 DIAGNOSIS — L0211 Cutaneous abscess of neck: Secondary | ICD-10-CM | POA: Diagnosis not present

## 2018-03-27 DIAGNOSIS — L723 Sebaceous cyst: Secondary | ICD-10-CM | POA: Diagnosis not present

## 2018-03-27 DIAGNOSIS — L089 Local infection of the skin and subcutaneous tissue, unspecified: Secondary | ICD-10-CM | POA: Diagnosis not present

## 2018-03-27 NOTE — H&P (Signed)
Starleen Arms Documented: 03/27/2018 10:59 AM Location: [facility] Patient #: [id number] DOB: 1950-05-22 Married / Language: English / Race: Black or African American Male  History of Present Illness Marcello Moores A. Che Rachal MD; 03/27/2018 11:26 AM) Patient words: Patient sent at the request of Dr. Shelia Media for growth on the patient's posterior neck. His been there for many months. His become more painful. He has a history of a sebaceous cyst being excised from that site any years ago. He developed more redness and pain over the weekend and was seen in the emergency room. He was placed on antibiotics and sent to the office for further evaluation. He denies any fever or chills. He denies any drainage. It is tender to touch and warm.  The patient is a 67 year old male.   Past Surgical History Emeline Gins, CMA; 03/27/2018 10:59 AM) Colon Polyp Removal - Colonoscopy Hip Surgery Left.  Diagnostic Studies History Emeline Gins, [place]; 03/27/2018 10:59 AM) Colonoscopy 5-10 years ago  Allergies Emeline Gins, CMA; 03/27/2018 11:00 AM) No Known Drug Allergies [03/27/2018]: Allergies Reconciled  Medication History Emeline Gins, CMA; 03/27/2018 11:01 AM) traMADol HCl (50MG  Tablet, Oral) Active. amLODIPine Besylate (10MG  Tablet, Oral) Active. Synjardy (12.5-1000MG  Tablet, Oral) Active. Losartan Potassium (100MG  Tablet, Oral) Active. Accu-Chek Guide (In Vitro) Active. Accu-Chek FastClix Lancets Active. Doxycycline Hyclate (100MG  Capsule, Oral) Active. Sertraline HCl (50MG  Tablet, Oral) Active. Simvastatin (20MG  Tablet, Oral) Active. Medications Reconciled  Social History Emeline Gins, [place]; 03/27/2018 10:59 AM) Alcohol use Remotely quit alcohol use. Caffeine use Coffee. Illicit drug use Remotely quit drug use. Tobacco use Never smoker.  Family History Emeline Gins, [place]; 03/27/2018 10:59 AM) Arthritis Father. Breast Cancer Mother,  Sister. Cancer Brother. Hypertension Father. Respiratory Condition Brother.  Other Problems Emeline Gins, CMA; 03/27/2018 10:59 AM) Diabetes Mellitus High blood pressure Hypercholesterolemia     Review of Systems Emeline Gins CMA; 03/27/2018 10:59 AM) General Not Present- Appetite Loss, Chills, Fatigue, Fever, Night Sweats, Weight Gain and Weight Loss. Skin Present- New Lesions. Not Present- Change in Wart/Mole, Dryness, Hives, Jaundice, Non-Healing Wounds, Rash and Ulcer. HEENT Not Present- Earache, Hearing Loss, Hoarseness, Nose Bleed, Oral Ulcers, Ringing in the Ears, Seasonal Allergies, Sinus Pain, Sore Throat, Visual Disturbances, Wears glasses/contact lenses and Yellow Eyes. Respiratory Not Present- Bloody sputum, Chronic Cough, Difficulty Breathing, Snoring and Wheezing. Breast Not Present- Breast Mass, Breast Pain, Nipple Discharge and Skin Changes. Cardiovascular Not Present- Chest Pain, Difficulty Breathing Lying Down, Leg Cramps, Palpitations, Rapid Heart Rate, Shortness of Breath and Swelling of Extremities. Gastrointestinal Not Present- Abdominal Pain, Bloating, Bloody Stool, Change in Bowel Habits, Chronic diarrhea, Constipation, Difficulty Swallowing, Excessive gas, Gets full quickly at meals, Hemorrhoids, Indigestion, Nausea, Rectal Pain and Vomiting. Male Genitourinary Not Present- Blood in Urine, Change in Urinary Stream, Frequency, Impotence, Nocturia, Painful Urination, Urgency and Urine Leakage.  Vitals Emeline Gins CMA; 03/27/2018 11:00 AM) 03/27/2018 10:59 AM Weight: 264.38 lb Height: 72in Body Surface Area: 2.4 m Body Mass Index: 35.86 kg/m  Temp.: 98.44F  Pulse: 101 (Regular)  BP: 140/86 (Sitting, Left Arm, Standard)      Physical Exam (Sharis Keeran A. Gilad Dugger MD; 03/27/2018 11:27 AM)  General Mental Status-Alert. General Appearance-Consistent with stated age. Hydration-Well hydrated. Voice-Normal.  Integumentary Note:  Posterior neck is an old scar. There is a 5 cm red and inflamed infected sebaceous cyst. I recommend drainage today in the office which the patient has agreed to due to the infection. Risks, benefits and other options were discussed as well.  Chest and Lung  Exam Chest and lung exam reveals -quiet, even and easy respiratory effort with no use of accessory muscles and on auscultation, normal breath sounds, no adventitious sounds and normal vocal resonance. Inspection Chest Wall - Normal. Back - normal.  Cardiovascular Cardiovascular examination reveals -normal heart sounds, regular rate and rhythm with no murmurs and normal pedal pulses bilaterally.  Neurologic Neurologic evaluation reveals -alert and oriented x 3 with no impairment of recent or remote memory. Mental Status-Normal.  Musculoskeletal Normal Exam - Left-Upper Extremity Strength Normal and Lower Extremity Strength Normal. Normal Exam - Right-Upper Extremity Strength Normal and Lower Extremity Strength Normal.    Assessment & Plan (Trixy Loyola A. Bao Bazen MD; 03/27/2018 11:28 AM)  NECK ABSCESS (L02.11) Impression: Recommendations today since this appears to be an infected sebaceous cyst. Risk of bleeding, infection, recurrence, worsening of symptoms discussed. Recommend outpatient cyst excision once infection clears. He is already on antibiotics consisting of doxycycline and I recommend he finish those. Postoperative instructions were given as well.  Current Plans DRAIN SKIN ABSCESS, SIMPLE/SINGLE (10060) The pathophysiology of subcutaneous abscess and differential diagnosis was discussed. Natural history progression was discussed. The patient's symptoms are not adequately controlled. Non-operative treatment has not healed the abscess. Therefore, I recommended incision & drainage of the abscess to allow the infection to resolve and heal. Technique, risks, benefits, alternatives discussed. The patient expressed  understanding & wished to proceed.  I placed a field block with local anaesthetic. I incised the skin over the abscess to release the infection. I excised skin at the wound to have an adequate opening for drainage & prevent skin reclosure. I packed the wound with ribbon NU-Gauze.  The patient tolerated the procedure. We will have the patient return to clinic for close follow up to make sure the infection heals.   INFECTED SEBACEOUS CYST (L72.3)  Current Plans Pt Education - [facility] Wound Care Instructions (Gross): discussed with patient and provided information. The pathophysiology of skin & subcutaneous masses was discussed. Natural history risks without surgery were discussed. I recommended surgery to remove the mass. I explained the technique of removal with use of local anesthesia & possible need for more aggressive sedation/anesthesia for patient comfort.  Risks such as bleeding, infection, wound breakdown, heart attack, death, and other risks were discussed. I noted a good likelihood this will help address the problem. Possibility that this will not correct all symptoms was explained. Possibility of regrowth/recurrence of the mass was discussed. We will work to minimize complications. Questions were answered. The patient expresses understanding & wishes to proceed with surgery.

## 2018-04-12 DIAGNOSIS — E119 Type 2 diabetes mellitus without complications: Secondary | ICD-10-CM | POA: Diagnosis not present

## 2018-04-12 DIAGNOSIS — Z1159 Encounter for screening for other viral diseases: Secondary | ICD-10-CM | POA: Diagnosis not present

## 2018-04-12 DIAGNOSIS — E7800 Pure hypercholesterolemia, unspecified: Secondary | ICD-10-CM | POA: Diagnosis not present

## 2018-04-12 DIAGNOSIS — Z125 Encounter for screening for malignant neoplasm of prostate: Secondary | ICD-10-CM | POA: Diagnosis not present

## 2018-04-12 DIAGNOSIS — I10 Essential (primary) hypertension: Secondary | ICD-10-CM | POA: Diagnosis not present

## 2018-04-17 DIAGNOSIS — Z0000 Encounter for general adult medical examination without abnormal findings: Secondary | ICD-10-CM | POA: Diagnosis not present

## 2018-04-17 DIAGNOSIS — I10 Essential (primary) hypertension: Secondary | ICD-10-CM | POA: Diagnosis not present

## 2018-04-17 DIAGNOSIS — E118 Type 2 diabetes mellitus with unspecified complications: Secondary | ICD-10-CM | POA: Diagnosis not present

## 2018-04-17 DIAGNOSIS — E785 Hyperlipidemia, unspecified: Secondary | ICD-10-CM | POA: Diagnosis not present

## 2018-04-17 DIAGNOSIS — Z1212 Encounter for screening for malignant neoplasm of rectum: Secondary | ICD-10-CM | POA: Diagnosis not present

## 2018-04-17 DIAGNOSIS — Z7982 Long term (current) use of aspirin: Secondary | ICD-10-CM | POA: Diagnosis not present

## 2018-04-17 DIAGNOSIS — Z23 Encounter for immunization: Secondary | ICD-10-CM | POA: Diagnosis not present

## 2018-04-20 ENCOUNTER — Encounter (HOSPITAL_BASED_OUTPATIENT_CLINIC_OR_DEPARTMENT_OTHER): Payer: Self-pay | Admitting: *Deleted

## 2018-04-20 ENCOUNTER — Other Ambulatory Visit: Payer: Self-pay

## 2018-04-21 DIAGNOSIS — L0211 Cutaneous abscess of neck: Secondary | ICD-10-CM | POA: Diagnosis not present

## 2018-04-27 ENCOUNTER — Encounter (HOSPITAL_BASED_OUTPATIENT_CLINIC_OR_DEPARTMENT_OTHER)
Admission: RE | Admit: 2018-04-27 | Discharge: 2018-04-27 | Disposition: A | Discharge status: Home / Self Care | Payer: Medicare Other | Source: Ambulatory Visit | Attending: Surgery | Admitting: Surgery

## 2018-04-27 DIAGNOSIS — Z01812 Encounter for preprocedural laboratory examination: Secondary | ICD-10-CM | POA: Insufficient documentation

## 2018-04-27 LAB — CBC WITH DIFFERENTIAL/PLATELET
Abs Immature Granulocytes: 0.02 10*3/uL (ref 0.00–0.07)
BASOS PCT: 1 %
Basophils Absolute: 0 10*3/uL (ref 0.0–0.1)
EOS ABS: 0.1 10*3/uL (ref 0.0–0.5)
Eosinophils Relative: 3 %
HCT: 44.2 % (ref 39.0–52.0)
Hemoglobin: 15.2 g/dL (ref 13.0–17.0)
Immature Granulocytes: 0 %
Lymphocytes Relative: 44 %
Lymphs Abs: 2.3 10*3/uL (ref 0.7–4.0)
MCH: 28.7 pg (ref 26.0–34.0)
MCHC: 34.4 g/dL (ref 30.0–36.0)
MCV: 83.4 fL (ref 80.0–100.0)
Monocytes Absolute: 0.5 10*3/uL (ref 0.1–1.0)
Monocytes Relative: 9 %
Neutro Abs: 2.3 10*3/uL (ref 1.7–7.7)
Neutrophils Relative %: 43 %
PLATELETS: 209 10*3/uL (ref 150–400)
RBC: 5.3 MIL/uL (ref 4.22–5.81)
RDW: 12.5 % (ref 11.5–15.5)
WBC: 5.3 10*3/uL (ref 4.0–10.5)
nRBC: 0 % (ref 0.0–0.2)

## 2018-04-27 LAB — COMPREHENSIVE METABOLIC PANEL
ALT: 28 U/L (ref 0–44)
AST: 23 U/L (ref 15–41)
Albumin: 4.3 g/dL (ref 3.5–5.0)
Alkaline Phosphatase: 51 U/L (ref 38–126)
Anion gap: 12 (ref 5–15)
BUN: 12 mg/dL (ref 8–23)
CO2: 22 mmol/L (ref 22–32)
Calcium: 9.2 mg/dL (ref 8.9–10.3)
Chloride: 103 mmol/L (ref 98–111)
Creatinine, Ser: 0.97 mg/dL (ref 0.61–1.24)
GFR calc Af Amer: >60 mL/min (ref >60)
GFR calc non Af Amer: >60 mL/min (ref >60)
Glucose, Bld: 94 mg/dL (ref 70–99)
Potassium: 4.2 mmol/L (ref 3.5–5.1)
SODIUM: 137 mmol/L (ref 135–145)
Total Bilirubin: 0.6 mg/dL (ref 0.3–1.2)
Total Protein: 7.6 g/dL (ref 6.5–8.1)

## 2018-04-27 NOTE — Progress Notes (Signed)
Ensure pre surgery drink given with instructions to complete by 0600 dos, pt verbalized understanding.  Contacted Dr. Shelia Media office to obtain EKG, states they will fax over.

## 2018-05-02 ENCOUNTER — Other Ambulatory Visit: Payer: Self-pay

## 2018-05-02 ENCOUNTER — Ambulatory Visit (HOSPITAL_BASED_OUTPATIENT_CLINIC_OR_DEPARTMENT_OTHER): Payer: Medicare Other | Admitting: Anesthesiology

## 2018-05-02 ENCOUNTER — Encounter (HOSPITAL_BASED_OUTPATIENT_CLINIC_OR_DEPARTMENT_OTHER)
Admission: RE | Disposition: A | Discharge status: Home / Self Care | Payer: Self-pay | Source: Ambulatory Visit | Attending: Surgery

## 2018-05-02 ENCOUNTER — Encounter (HOSPITAL_BASED_OUTPATIENT_CLINIC_OR_DEPARTMENT_OTHER): Payer: Self-pay

## 2018-05-02 ENCOUNTER — Ambulatory Visit (HOSPITAL_BASED_OUTPATIENT_CLINIC_OR_DEPARTMENT_OTHER)
Admission: RE | Admit: 2018-05-02 | Discharge: 2018-05-02 | Disposition: A | Discharge status: Home / Self Care | Payer: Medicare Other | Source: Ambulatory Visit | Attending: Surgery | Admitting: Surgery

## 2018-05-02 DIAGNOSIS — Z86010 Personal history of colonic polyps: Secondary | ICD-10-CM | POA: Insufficient documentation

## 2018-05-02 DIAGNOSIS — Z809 Family history of malignant neoplasm, unspecified: Secondary | ICD-10-CM | POA: Diagnosis not present

## 2018-05-02 DIAGNOSIS — I10 Essential (primary) hypertension: Secondary | ICD-10-CM | POA: Diagnosis not present

## 2018-05-02 DIAGNOSIS — Z79899 Other long term (current) drug therapy: Secondary | ICD-10-CM | POA: Insufficient documentation

## 2018-05-02 DIAGNOSIS — Z803 Family history of malignant neoplasm of breast: Secondary | ICD-10-CM | POA: Insufficient documentation

## 2018-05-02 DIAGNOSIS — L720 Epidermal cyst: Secondary | ICD-10-CM | POA: Diagnosis not present

## 2018-05-02 DIAGNOSIS — Z8249 Family history of ischemic heart disease and other diseases of the circulatory system: Secondary | ICD-10-CM | POA: Insufficient documentation

## 2018-05-02 DIAGNOSIS — E7800 Pure hypercholesterolemia, unspecified: Secondary | ICD-10-CM | POA: Insufficient documentation

## 2018-05-02 DIAGNOSIS — Z8261 Family history of arthritis: Secondary | ICD-10-CM | POA: Diagnosis not present

## 2018-05-02 DIAGNOSIS — E119 Type 2 diabetes mellitus without complications: Secondary | ICD-10-CM | POA: Insufficient documentation

## 2018-05-02 DIAGNOSIS — Z836 Family history of other diseases of the respiratory system: Secondary | ICD-10-CM | POA: Insufficient documentation

## 2018-05-02 HISTORY — DX: Type 2 diabetes mellitus without complications: E11.9

## 2018-05-02 HISTORY — PX: CYST EXCISION: SHX5701

## 2018-05-02 LAB — GLUCOSE, CAPILLARY
Glucose-Capillary: 106 mg/dL — ABNORMAL HIGH (ref 70–99)
Glucose-Capillary: 97 mg/dL (ref 70–99)

## 2018-05-02 SURGERY — CYST REMOVAL
Anesthesia: General

## 2018-05-02 MED ORDER — DEXAMETHASONE SODIUM PHOSPHATE 10 MG/ML IJ SOLN
INTRAMUSCULAR | Status: AC
  Filled 2018-05-02: qty 1

## 2018-05-02 MED ORDER — DEXAMETHASONE SODIUM PHOSPHATE 10 MG/ML IJ SOLN
INTRAMUSCULAR | Status: DC | PRN
  Administered 2018-05-02: 10 mg via INTRAVENOUS

## 2018-05-02 MED ORDER — ROCURONIUM BROMIDE 50 MG/5ML IV SOSY
PREFILLED_SYRINGE | INTRAVENOUS | Status: AC
  Filled 2018-05-02: qty 5

## 2018-05-02 MED ORDER — MIDAZOLAM HCL 2 MG/2ML IJ SOLN
INTRAMUSCULAR | Status: AC
  Filled 2018-05-02: qty 2

## 2018-05-02 MED ORDER — GABAPENTIN 300 MG PO CAPS
300.0000 mg | ORAL_CAPSULE | ORAL | Status: AC
  Administered 2018-05-02: 300 mg via ORAL

## 2018-05-02 MED ORDER — ACETAMINOPHEN 325 MG PO TABS: 325.0000 mg | ORAL_TABLET | ORAL | Status: DC | PRN

## 2018-05-02 MED ORDER — BACITRACIN-NEOMYCIN-POLYMYXIN 400-5-5000 EX OINT
TOPICAL_OINTMENT | CUTANEOUS | Status: AC
  Filled 2018-05-02: qty 1

## 2018-05-02 MED ORDER — SUGAMMADEX SODIUM 500 MG/5ML IV SOLN
INTRAVENOUS | Status: AC
  Filled 2018-05-02: qty 5

## 2018-05-02 MED ORDER — FENTANYL CITRATE (PF) 100 MCG/2ML IJ SOLN
INTRAMUSCULAR | Status: AC
  Filled 2018-05-02: qty 2

## 2018-05-02 MED ORDER — CELECOXIB 200 MG PO CAPS
ORAL_CAPSULE | ORAL | Status: AC
  Filled 2018-05-02: qty 1

## 2018-05-02 MED ORDER — CEFAZOLIN SODIUM-DEXTROSE 2-4 GM/100ML-% IV SOLN
INTRAVENOUS | Status: AC
  Filled 2018-05-02: qty 100

## 2018-05-02 MED ORDER — MIDAZOLAM HCL 2 MG/2ML IJ SOLN: 1.0000 mg | INTRAMUSCULAR | Status: DC | PRN

## 2018-05-02 MED ORDER — LIDOCAINE 2% (20 MG/ML) 5 ML SYRINGE
INTRAMUSCULAR | Status: AC
  Filled 2018-05-02: qty 5

## 2018-05-02 MED ORDER — FENTANYL CITRATE (PF) 100 MCG/2ML IJ SOLN: 25.0000 ug | INTRAMUSCULAR | Status: DC | PRN

## 2018-05-02 MED ORDER — BUPIVACAINE-EPINEPHRINE (PF) 0.25% -1:200000 IJ SOLN
INTRAMUSCULAR | Status: AC
  Filled 2018-05-02: qty 30

## 2018-05-02 MED ORDER — CHLORHEXIDINE GLUCONATE CLOTH 2 % EX PADS: 6.0000 | MEDICATED_PAD | Freq: Once | CUTANEOUS | Status: DC

## 2018-05-02 MED ORDER — ACETAMINOPHEN 500 MG PO TABS
1000.0000 mg | ORAL_TABLET | ORAL | Status: AC
  Administered 2018-05-02: 1000 mg via ORAL

## 2018-05-02 MED ORDER — ONDANSETRON HCL 4 MG/2ML IJ SOLN
INTRAMUSCULAR | Status: DC | PRN
  Administered 2018-05-02: 4 mg via INTRAVENOUS

## 2018-05-02 MED ORDER — OXYCODONE HCL 5 MG PO TABS: 5.0000 mg | ORAL_TABLET | Freq: Four times a day (QID) | ORAL | 0 refills | Status: DC | PRN

## 2018-05-02 MED ORDER — SUGAMMADEX SODIUM 500 MG/5ML IV SOLN
INTRAVENOUS | Status: DC | PRN
  Administered 2018-05-02: 250 mg via INTRAVENOUS

## 2018-05-02 MED ORDER — IBUPROFEN 800 MG PO TABS: 800.0000 mg | ORAL_TABLET | Freq: Three times a day (TID) | ORAL | 0 refills | Status: DC | PRN

## 2018-05-02 MED ORDER — GABAPENTIN 300 MG PO CAPS
ORAL_CAPSULE | ORAL | Status: AC
  Filled 2018-05-02: qty 1

## 2018-05-02 MED ORDER — PHENYLEPHRINE 40 MCG/ML (10ML) SYRINGE FOR IV PUSH (FOR BLOOD PRESSURE SUPPORT)
PREFILLED_SYRINGE | INTRAVENOUS | Status: AC
  Filled 2018-05-02: qty 10

## 2018-05-02 MED ORDER — MEPERIDINE HCL 25 MG/ML IJ SOLN: 6.2500 mg | INTRAMUSCULAR | Status: DC | PRN

## 2018-05-02 MED ORDER — PROPOFOL 10 MG/ML IV BOLUS
INTRAVENOUS | Status: AC
  Filled 2018-05-02: qty 20

## 2018-05-02 MED ORDER — OXYCODONE HCL 5 MG PO TABS: 5.0000 mg | ORAL_TABLET | Freq: Once | ORAL | Status: DC | PRN

## 2018-05-02 MED ORDER — ACETAMINOPHEN 500 MG PO TABS
ORAL_TABLET | ORAL | Status: AC
  Filled 2018-05-02: qty 2

## 2018-05-02 MED ORDER — ONDANSETRON HCL 4 MG/2ML IJ SOLN: 4.0000 mg | Freq: Once | INTRAMUSCULAR | Status: DC | PRN

## 2018-05-02 MED ORDER — SCOPOLAMINE 1 MG/3DAYS TD PT72: 1.0000 | MEDICATED_PATCH | Freq: Once | TRANSDERMAL | Status: DC | PRN

## 2018-05-02 MED ORDER — FENTANYL CITRATE (PF) 100 MCG/2ML IJ SOLN: 50.0000 ug | INTRAMUSCULAR | Status: DC | PRN

## 2018-05-02 MED ORDER — ONDANSETRON HCL 4 MG/2ML IJ SOLN
INTRAMUSCULAR | Status: AC
  Filled 2018-05-02: qty 2

## 2018-05-02 MED ORDER — CELECOXIB 200 MG PO CAPS
200.0000 mg | ORAL_CAPSULE | ORAL | Status: AC
  Administered 2018-05-02: 200 mg via ORAL

## 2018-05-02 MED ORDER — FENTANYL CITRATE (PF) 250 MCG/5ML IJ SOLN
INTRAMUSCULAR | Status: DC | PRN
  Administered 2018-05-02: 100 ug via INTRAVENOUS

## 2018-05-02 MED ORDER — PROPOFOL 10 MG/ML IV BOLUS
INTRAVENOUS | Status: DC | PRN
  Administered 2018-05-02: 200 mg via INTRAVENOUS

## 2018-05-02 MED ORDER — ACETAMINOPHEN 160 MG/5ML PO SOLN: 325.0000 mg | ORAL | Status: DC | PRN

## 2018-05-02 MED ORDER — OXYCODONE HCL 5 MG/5ML PO SOLN: 5.0000 mg | Freq: Once | ORAL | Status: DC | PRN

## 2018-05-02 MED ORDER — BUPIVACAINE-EPINEPHRINE 0.25% -1:200000 IJ SOLN
INTRAMUSCULAR | Status: DC | PRN
  Administered 2018-05-02: 10 mL

## 2018-05-02 MED ORDER — ROCURONIUM BROMIDE 100 MG/10ML IV SOLN
INTRAVENOUS | Status: DC | PRN
  Administered 2018-05-02: 40 mg via INTRAVENOUS

## 2018-05-02 MED ORDER — LIDOCAINE HCL (CARDIAC) PF 100 MG/5ML IV SOSY
PREFILLED_SYRINGE | INTRAVENOUS | Status: DC | PRN
  Administered 2018-05-02: 80 mg via INTRAVENOUS

## 2018-05-02 MED ORDER — MIDAZOLAM HCL 2 MG/2ML IJ SOLN
INTRAMUSCULAR | Status: DC | PRN
  Administered 2018-05-02: 2 mg via INTRAVENOUS

## 2018-05-02 MED ORDER — CEFAZOLIN SODIUM-DEXTROSE 2-4 GM/100ML-% IV SOLN
2.0000 g | INTRAVENOUS | Status: AC
  Administered 2018-05-02: 2 g via INTRAVENOUS

## 2018-05-02 MED ORDER — LACTATED RINGERS IV SOLN
INTRAVENOUS | Status: DC
  Administered 2018-05-02: 09:00:00 via INTRAVENOUS

## 2018-05-02 MED ORDER — PHENYLEPHRINE 40 MCG/ML (10ML) SYRINGE FOR IV PUSH (FOR BLOOD PRESSURE SUPPORT)
PREFILLED_SYRINGE | INTRAVENOUS | Status: DC | PRN
  Administered 2018-05-02 (×3): 80 ug via INTRAVENOUS
  Administered 2018-05-02: 40 ug via INTRAVENOUS

## 2018-05-02 SURGICAL SUPPLY — 38 items
BENZOIN TINCTURE PRP APPL 2/3 (GAUZE/BANDAGES/DRESSINGS) IMPLANT
BLADE CLIPPER SURG (BLADE) ×1 IMPLANT
BLADE SURG 10 STRL SS (BLADE) ×1 IMPLANT
BLADE SURG 15 STRL LF DISP TIS (BLADE) ×1 IMPLANT
BLADE SURG 15 STRL SS (BLADE) ×1
CANISTER SUCT 1200ML W/VALVE (MISCELLANEOUS) IMPLANT
CHLORAPREP W/TINT 26ML (MISCELLANEOUS) ×2 IMPLANT
COVER BACK TABLE 60X90IN (DRAPES) ×2 IMPLANT
COVER MAYO STAND STRL (DRAPES) ×2 IMPLANT
COVER WAND RF STERILE (DRAPES) IMPLANT
DECANTER SPIKE VIAL GLASS SM (MISCELLANEOUS) IMPLANT
DRAPE LAPAROTOMY 100X72 PEDS (DRAPES) ×2 IMPLANT
DRAPE UTILITY XL STRL (DRAPES) ×2 IMPLANT
ELECT COATED BLADE 2.86 ST (ELECTRODE) ×2 IMPLANT
ELECT REM PT RETURN 9FT ADLT (ELECTROSURGICAL) ×2
ELECTRODE REM PT RTRN 9FT ADLT (ELECTROSURGICAL) ×1 IMPLANT
GAUZE SPONGE 4X4 12PLY STRL LF (GAUZE/BANDAGES/DRESSINGS) ×2 IMPLANT
GLOVE BIOGEL PI IND STRL 8 (GLOVE) ×1 IMPLANT
GLOVE BIOGEL PI INDICATOR 8 (GLOVE) ×1
GLOVE ECLIPSE 8.0 STRL XLNG CF (GLOVE) ×2 IMPLANT
GOWN STRL REUS W/ TWL LRG LVL3 (GOWN DISPOSABLE) ×2 IMPLANT
GOWN STRL REUS W/TWL LRG LVL3 (GOWN DISPOSABLE) ×2
NDL HYPO 25X1 1.5 SAFETY (NEEDLE) ×1 IMPLANT
NEEDLE HYPO 25X1 1.5 SAFETY (NEEDLE) ×2 IMPLANT
NS IRRIG 1000ML POUR BTL (IV SOLUTION) IMPLANT
PACK BASIN DAY SURGERY FS (CUSTOM PROCEDURE TRAY) ×2 IMPLANT
PENCIL BUTTON HOLSTER BLD 10FT (ELECTRODE) ×2 IMPLANT
SPONGE LAP 4X18 RFD (DISPOSABLE) ×2 IMPLANT
STRIP CLOSURE SKIN 1/2X4 (GAUZE/BANDAGES/DRESSINGS) IMPLANT
SUT ETHILON 2 0 FS 18 (SUTURE) ×2 IMPLANT
SUT MON AB 4-0 PC3 18 (SUTURE) ×2 IMPLANT
SUT VICRYL 3-0 CR8 SH (SUTURE) ×2 IMPLANT
SUT VICRYL AB 3 0 TIES (SUTURE) IMPLANT
SYR CONTROL 10ML LL (SYRINGE) ×2 IMPLANT
TOWEL GREEN STERILE FF (TOWEL DISPOSABLE) ×4 IMPLANT
TOWEL OR NON WOVEN STRL DISP B (DISPOSABLE) ×2 IMPLANT
TUBE CONNECTING 20X1/4 (TUBING) IMPLANT
YANKAUER SUCT BULB TIP NO VENT (SUCTIONS) IMPLANT

## 2018-05-02 NOTE — Anesthesia Postprocedure Evaluation (Signed)
Anesthesia Post Note  Patient: Julian Porter  Procedure(s) Performed: EXCISION CYST POSTERIOR NECK ERAS PATHWAY (N/A )     Patient location during evaluation: PACU Anesthesia Type: General Level of consciousness: awake and alert Pain management: pain level controlled Vital Signs Assessment: post-procedure vital signs reviewed and stable Respiratory status: spontaneous breathing, nonlabored ventilation, respiratory function stable and patient connected to nasal cannula oxygen Cardiovascular status: blood pressure returned to baseline and stable Postop Assessment: no apparent nausea or vomiting Anesthetic complications: no    Last Vitals:  Vitals:   05/02/18 1015 05/02/18 1030  BP: 131/78 130/77  Pulse: 80 81  Resp: 16 10  Temp: 37.1 C   SpO2: 95% 97%    Last Pain:  Vitals:   05/02/18 1015  TempSrc:   PainSc: 0-No pain                 Aili Casillas

## 2018-05-02 NOTE — Op Note (Signed)
Preoperative diagnosis: 4 cm x 3 cm inflamed posterior neck epidermal inclusion cyst subcutaneous  Postoperative diagnosis: Same  Procedure excision of 4 cm x 3 cm inflamed posterior neck epidermal inclusion cyst  Surgeon: Erroll Luna, MD  Anesthesia: General with 0.25% Sensorcaine with epinephrine local  EBL: 10 cc  Specimen: Cyst and skin to pathology  Drains: None  Indications for procedure: The patient is a 2 68-year-old male with an inflamed epidermal inclusion cyst.  He said previous drainage but recurrence of this.  I recommend excision to prevent recurrence.  He has been on antibiotics as well in the past for the same problem.  Risk, benefits and long-term expectations as well as wound complications and wound infection discussed with the patient preoperatively.The procedure has been discussed with the patient.  Alternative therapies have been discussed with the patient.  Operative risks include bleeding,  Infection,  Organ injury,  Nerve injury,  Blood vessel injury,  DVT,  Pulmonary embolism,  Death,  And possible reoperation.  Medical management risks include worsening of present situation.  The success of the procedure is 50 -90 % at treating patients symptoms.  The patient understands and agrees to proceed.   Description of procedure: The patient was met in the holding area.  The area to the posterior neck was marked.  Questions were answered.  He was taken back to the operating room.  He was intubated and placed prone on the operating table.  He was padded appropriately.  He was then prepped and draped in sterile fashion.  The posterior neck was then draped out.  Timeout was done.  The cyst was located at the superior part of his neck at the base of his scalp.  Curvilinear incision was made above and below the cyst.  This was dissected into a subcutaneous fat down to the musculature of his neck.  This was anterior to that.  He was then excised without difficulty.  There is  significant chronic inflammatory change around it.  There is no evidence of any active infection.  Wound is irrigated.  It was then closed with 2-0 nylon suture.  Neosporin dry dressing applied.  All final counts found to be correct sponge, needles and instruments.  He was then placed back supine extubated taken to recovery in satisfactory condition.  All final counts were found to be correct.

## 2018-05-02 NOTE — Transfer of Care (Signed)
Immediate Anesthesia Transfer of Care Note  Patient: Julian Porter  Procedure(s) Performed: EXCISION CYST POSTERIOR NECK ERAS PATHWAY (N/A )  Patient Location: PACU  Anesthesia Type:General  Level of Consciousness: awake, alert , oriented, drowsy and patient cooperative  Airway & Oxygen Therapy: Patient Spontanous Breathing and Patient connected to face mask oxygen  Post-op Assessment: Report given to RN and Post -op Vital signs reviewed and stable  Post vital signs: Reviewed and stable  Last Vitals:  Vitals Value Taken Time  BP 131/78 05/02/2018 10:15 AM  Temp    Pulse 76 05/02/2018 10:16 AM  Resp 15 05/02/2018 10:16 AM  SpO2 95 % 05/02/2018 10:16 AM  Vitals shown include unvalidated device data.  Last Pain:  Vitals:   05/02/18 0839  TempSrc: Oral  PainSc: 0-No pain         Complications: No apparent anesthesia complications

## 2018-05-02 NOTE — Anesthesia Preprocedure Evaluation (Addendum)
Anesthesia Evaluation  Patient identified by MRN, date of birth, ID band Patient awake    Reviewed: Allergy & Precautions, H&P , NPO status , Patient's Chart, lab work & pertinent test results, reviewed documented beta blocker date and time   Airway Mallampati: I  TM Distance: >3 FB Neck ROM: full    Dental no notable dental hx. (+) Partial Upper, Partial Lower,    Pulmonary neg pulmonary ROS,    Pulmonary exam normal breath sounds clear to auscultation       Cardiovascular Exercise Tolerance: Good hypertension, Pt. on medications negative cardio ROS   Rhythm:regular Rate:Normal     Neuro/Psych negative neurological ROS  negative psych ROS   GI/Hepatic negative GI ROS, Neg liver ROS,   Endo/Other  negative endocrine ROSdiabetes  Renal/GU negative Renal ROS  negative genitourinary   Musculoskeletal   Abdominal   Peds  Hematology negative hematology ROS (+)   Anesthesia Other Findings   Reproductive/Obstetrics negative OB ROS                            Anesthesia Physical Anesthesia Plan  ASA: II  Anesthesia Plan: General   Post-op Pain Management:    Induction: Intravenous  PONV Risk Score and Plan: 2 and Ondansetron, Treatment may vary due to age or medical condition and Dexamethasone  Airway Management Planned: LMA and Oral ETT  Additional Equipment:   Intra-op Plan:   Post-operative Plan: Extubation in OR  Informed Consent: I have reviewed the patients History and Physical, chart, labs and discussed the procedure including the risks, benefits and alternatives for the proposed anesthesia with the patient or authorized representative who has indicated his/her understanding and acceptance.   Dental Advisory Given  Plan Discussed with: CRNA, Anesthesiologist and Surgeon  Anesthesia Plan Comments: (  )        Anesthesia Quick Evaluation

## 2018-05-02 NOTE — Anesthesia Procedure Notes (Signed)
Procedure Name: Intubation Date/Time: 05/02/2018 9:28 AM Performed by: Raenette Rover, CRNA Pre-anesthesia Checklist: Patient identified, Emergency Drugs available, Suction available and Patient being monitored Patient Re-evaluated:Patient Re-evaluated prior to induction Oxygen Delivery Method: Circle system utilized Preoxygenation: Pre-oxygenation with 100% oxygen Induction Type: IV induction Ventilation: Mask ventilation without difficulty Laryngoscope Size: Mac and 4 Grade View: Grade I Tube type: Oral Tube size: 8.0 mm Number of attempts: 1 Airway Equipment and Method: Stylet Placement Confirmation: ETT inserted through vocal cords under direct vision,  positive ETCO2,  CO2 detector and breath sounds checked- equal and bilateral Secured at: 23 cm Tube secured with: Tape Dental Injury: Teeth and Oropharynx as per pre-operative assessment

## 2018-05-02 NOTE — Discharge Instructions (Signed)
°  NO TYLENOL BEFORE 2:45PM TODAY!      Apply Neosporin to wound daily.  Cover with dry dressing and change as needed.  Expect some mild to moderate drainage.  Return to office in 2 weeks for suture removal.  May use Tylenol or Advil for pain.  Okay to shower starting tomorrow.  May drive in 48 hours.     Post Anesthesia Home Care Instructions  Activity: Get plenty of rest for the remainder of the day. A responsible individual must stay with you for 24 hours following the procedure.  For the next 24 hours, DO NOT: -Drive a car -Paediatric nurse -Drink alcoholic beverages -Take any medication unless instructed by your physician -Make any legal decisions or sign important papers.  Meals: Start with liquid foods such as gelatin or soup. Progress to regular foods as tolerated. Avoid greasy, spicy, heavy foods. If nausea and/or vomiting occur, drink only clear liquids until the nausea and/or vomiting subsides. Call your physician if vomiting continues.  Special Instructions/Symptoms: Your throat may feel dry or sore from the anesthesia or the breathing tube placed in your throat during surgery. If this causes discomfort, gargle with warm salt water. The discomfort should disappear within 24 hours.  If you had a scopolamine patch placed behind your ear for the management of post- operative nausea and/or vomiting:  1. The medication in the patch is effective for 72 hours, after which it should be removed.  Wrap patch in a tissue and discard in the trash. Wash hands thoroughly with soap and water. 2. You may remove the patch earlier than 72 hours if you experience unpleasant side effects which may include dry mouth, dizziness or visual disturbances. 3. Avoid touching the patch. Wash your hands with soap and water after contact with the patch.

## 2018-05-02 NOTE — H&P (Signed)
Julian Porter  Location: [facility] Patient #: [id number] DOB: [birth year] Married / Language: English / Race: Black or African American Male  History of Present Illness  Patient words: Patient sent at the request of Dr. Shelia Media for growth on the patient's posterior neck. His been there for many months. His become more painful. He has a history of a sebaceous cyst being excised from that site any years ago. He developed more redness and pain over the weekend and was seen in the emergency room. He was placed on antibiotics and sent to the office for further evaluation. He denies any fever or chills. He denies any drainage. It is tender to touch and warm.  The patient is a 68 year old male.   Past Surgical History  Colon Polyp Removal - Colonoscopy Hip Surgery Left.  Diagnostic Studies History  Colonoscopy 5-10 years ago  Allergies No Known Drug Allergies Allergies Reconciled  Medication History  traMADol HCl (50MG  Tablet, Oral) Active. amLODIPine Besylate (10MG  Tablet, Oral) Active. Synjardy (12.5-1000MG  Tablet, Oral) Active. Losartan Potassium (100MG  Tablet, Oral) Active. Accu-Chek Guide (In Vitro) Active. Accu-Chek FastClix Lancets Active. Doxycycline Hyclate (100MG  Capsule, Oral) Active. Sertraline HCl (50MG  Tablet, Oral) Active. Simvastatin (20MG  Tablet, Oral) Active. Medications Reconciled  Social History Alcohol use Remotely quit alcohol use. Caffeine use Coffee. Illicit drug use Remotely quit drug use. Tobacco use Never smoker.  Family History  Arthritis Father. Breast Cancer Mother, Sister. Cancer Brother. Hypertension Father. Respiratory Condition Brother.  Other Problems  Diabetes Mellitus High blood pressure Hypercholesterolemia     Review of Systems  General Not Present- Appetite Loss, Chills, Fatigue, Fever, Night Sweats, Weight Gain and Weight Loss. Skin Present- New Lesions. Not Present-  Change in Wart/Mole, Dryness, Hives, Jaundice, Non-Healing Wounds, Rash and Ulcer. HEENT Not Present- Earache, Hearing Loss, Hoarseness, Nose Bleed, Oral Ulcers, Ringing in the Ears, Seasonal Allergies, Sinus Pain, Sore Throat, Visual Disturbances, Wears glasses/contact lenses and Yellow Eyes. Respiratory Not Present- Bloody sputum, Chronic Cough, Difficulty Breathing, Snoring and Wheezing. Breast Not Present- Breast Mass, Breast Pain, Nipple Discharge and Skin Changes. Cardiovascular Not Present- Chest Pain, Difficulty Breathing Lying Down, Leg Cramps, Palpitations, Rapid Heart Rate, Shortness of Breath and Swelling of Extremities. Gastrointestinal Not Present- Abdominal Pain, Bloating, Bloody Stool, Change in Bowel Habits, Chronic diarrhea, Constipation, Difficulty Swallowing, Excessive gas, Gets full quickly at meals, Hemorrhoids, Indigestion, Nausea, Rectal Pain and Vomiting. Male Genitourinary Not Present- Blood in Urine, Change in Urinary Stream, Frequency, Impotence, Nocturia, Painful Urination, Urgency and Urine Leakage.  Vitals Emeline Gins CMA; 03/27/2018 11:00 AM) 03/27/2018 10:59 AM Weight: 264.38 lb Height: 72in Body Surface Area: 2.4 m Body Mass Index: 35.86 kg/m  Temp.: 98.72F  Pulse: 101 (Regular)  BP: 140/86 (Sitting, Left Arm, Standard)      Physical Exam General Mental Status-Alert. General Appearance-Consistent with stated age. Hydration-Well hydrated. Voice-Normal.  Integumentary Note: Posterior neck is an old scar. There is a 5 cm red and inflamed infected sebaceous cyst. I recommend drainage today in the office which the patient has agreed to due to the infection. Risks, benefits and other options were discussed as well.  Chest and Lung Exam Chest and lung exam reveals -quiet, even and easy respiratory effort with no use of accessory muscles and on auscultation, normal breath sounds, no adventitious sounds and normal vocal  resonance. Inspection Chest Wall - Normal. Back - normal.  Cardiovascular Cardiovascular examination reveals -normal heart sounds, regular rate and rhythm with no murmurs and normal pedal pulses bilaterally.  Neurologic Neurologic evaluation reveals -alert and oriented x 3 with no impairment of recent or remote memory. Mental Status-Normal.  Musculoskeletal Normal Exam - Left-Upper Extremity Strength Normal and Lower Extremity Strength Normal. Normal Exam - Right-Upper Extremity Strength Normal and Lower Extremity Strength Normal.    Assessment & Plan   NECK ABSCESS (L02.11) Impression: Recommendations today since this appears to be an infected sebaceous cyst. Risk of bleeding, infection, recurrence, worsening of symptoms discussed. Recommend outpatient cyst excision once infection clears. He is already on antibiotics consisting of doxycycline and I recommend he finish those. Postoperative instructions were given as well.  Current Plans DRAIN SKIN ABSCESS, SIMPLE/SINGLE (10060) The pathophysiology of subcutaneous abscess and differential diagnosis was discussed. Natural history progression was discussed. The patient's symptoms are not adequately controlled. Non-operative treatment has not healed the abscess. Therefore, I recommended incision & drainage of the abscess to allow the infection to resolve and heal. Technique, risks, benefits, alternatives discussed. The patient expressed understanding & wished to proceed.  I placed a field block with local anaesthetic. I incised the skin over the abscess to release the infection. I excised skin at the wound to have an adequate opening for drainage & prevent skin reclosure. I packed the wound with ribbon NU-Gauze.  The patient tolerated the procedure. We will have the patient return to clinic for close follow up to make sure the infection heals.   INFECTED SEBACEOUS CYST (L72.3)  Current Plans Pt  Education - [facility] Wound Care Instructions (Gross): discussed with patient and provided information. The pathophysiology of skin & subcutaneous masses was discussed. Natural history risks without surgery were discussed. I recommended surgery to remove the mass. I explained the technique of removal with use of local anesthesia & possible need for more aggressive sedation/anesthesia for patient comfort.  Risks such as bleeding, infection, wound breakdown, heart attack, death, and other risks were discussed. I noted a good likelihood this will help address the problem. Possibility that this will not correct all symptoms was explained. Possibility of regrowth/recurrence of the mass was discussed. We will work to minimize complications. Questions were answered. The patient expresses understanding & wishes to proceed with surgery.

## 2018-05-02 NOTE — Interval H&P Note (Signed)
History and Physical Interval Note:  05/02/2018 8:24 AM  [name]  has presented today for surgery, with the diagnosis of CYST  The various methods of treatment have been discussed with the patient and family. After consideration of risks, benefits and other options for treatment, the patient has consented to  Procedure(s): EXCISION CYST [place] (N/A) as a surgical intervention .  The patient's history has been reviewed, patient examined, no change in status, stable for surgery.  I have reviewed the patient's chart and labs.  Questions were answered to the patient's satisfaction.     Julian Porter

## 2018-05-03 ENCOUNTER — Encounter (HOSPITAL_BASED_OUTPATIENT_CLINIC_OR_DEPARTMENT_OTHER): Payer: Self-pay | Admitting: Surgery

## 2018-05-29 DIAGNOSIS — E118 Type 2 diabetes mellitus with unspecified complications: Secondary | ICD-10-CM | POA: Diagnosis not present

## 2018-05-29 DIAGNOSIS — E119 Type 2 diabetes mellitus without complications: Secondary | ICD-10-CM | POA: Diagnosis not present

## 2018-06-27 DIAGNOSIS — I10 Essential (primary) hypertension: Secondary | ICD-10-CM | POA: Diagnosis not present

## 2018-06-27 DIAGNOSIS — E118 Type 2 diabetes mellitus with unspecified complications: Secondary | ICD-10-CM | POA: Diagnosis not present

## 2018-11-21 DIAGNOSIS — K760 Fatty (change of) liver, not elsewhere classified: Secondary | ICD-10-CM | POA: Diagnosis not present

## 2018-11-21 DIAGNOSIS — R945 Abnormal results of liver function studies: Secondary | ICD-10-CM | POA: Diagnosis not present

## 2018-11-23 DIAGNOSIS — R945 Abnormal results of liver function studies: Secondary | ICD-10-CM | POA: Diagnosis not present

## 2018-11-23 DIAGNOSIS — R740 Nonspecific elevation of levels of transaminase and lactic acid dehydrogenase [LDH]: Secondary | ICD-10-CM | POA: Diagnosis not present

## 2018-12-18 DIAGNOSIS — R32 Unspecified urinary incontinence: Secondary | ICD-10-CM | POA: Diagnosis not present

## 2019-01-11 DIAGNOSIS — H2513 Age-related nuclear cataract, bilateral: Secondary | ICD-10-CM | POA: Diagnosis not present

## 2019-01-11 DIAGNOSIS — H35033 Hypertensive retinopathy, bilateral: Secondary | ICD-10-CM | POA: Diagnosis not present

## 2019-01-11 DIAGNOSIS — H40023 Open angle with borderline findings, high risk, bilateral: Secondary | ICD-10-CM | POA: Diagnosis not present

## 2019-01-11 DIAGNOSIS — H35363 Drusen (degenerative) of macula, bilateral: Secondary | ICD-10-CM | POA: Diagnosis not present

## 2019-01-18 DIAGNOSIS — R32 Unspecified urinary incontinence: Secondary | ICD-10-CM | POA: Diagnosis not present

## 2019-01-31 DIAGNOSIS — H40023 Open angle with borderline findings, high risk, bilateral: Secondary | ICD-10-CM | POA: Diagnosis not present

## 2019-01-31 DIAGNOSIS — H40053 Ocular hypertension, bilateral: Secondary | ICD-10-CM | POA: Diagnosis not present

## 2019-03-01 DIAGNOSIS — H40021 Open angle with borderline findings, high risk, right eye: Secondary | ICD-10-CM | POA: Diagnosis not present

## 2019-03-14 DIAGNOSIS — H40022 Open angle with borderline findings, high risk, left eye: Secondary | ICD-10-CM | POA: Diagnosis not present

## 2019-03-21 ENCOUNTER — Other Ambulatory Visit: Payer: Self-pay

## 2019-03-21 DIAGNOSIS — Z20822 Contact with and (suspected) exposure to covid-19: Secondary | ICD-10-CM

## 2019-03-23 LAB — NOVEL CORONAVIRUS, NAA: SARS-CoV-2, NAA: NOT DETECTED

## 2019-05-01 ENCOUNTER — Ambulatory Visit: Payer: Medicare Other | Attending: Internal Medicine

## 2019-05-01 DIAGNOSIS — Z20822 Contact with and (suspected) exposure to covid-19: Secondary | ICD-10-CM

## 2019-05-02 LAB — NOVEL CORONAVIRUS, NAA: SARS-CoV-2, NAA: NOT DETECTED

## 2020-05-09 LAB — COLOGUARD: COLOGUARD: NEGATIVE

## 2020-09-03 ENCOUNTER — Other Ambulatory Visit: Payer: Self-pay | Admitting: Gastroenterology

## 2020-09-03 DIAGNOSIS — R7989 Other specified abnormal findings of blood chemistry: Secondary | ICD-10-CM

## 2020-09-03 DIAGNOSIS — R945 Abnormal results of liver function studies: Secondary | ICD-10-CM

## 2020-09-26 ENCOUNTER — Ambulatory Visit
Admission: RE | Admit: 2020-09-26 | Discharge: 2020-09-26 | Disposition: A | Discharge status: Home / Self Care | Payer: Medicare Other | Source: Ambulatory Visit | Attending: Gastroenterology | Admitting: Gastroenterology

## 2020-09-26 DIAGNOSIS — R7989 Other specified abnormal findings of blood chemistry: Secondary | ICD-10-CM

## 2020-09-26 DIAGNOSIS — R945 Abnormal results of liver function studies: Secondary | ICD-10-CM

## 2021-03-05 ENCOUNTER — Other Ambulatory Visit: Payer: Self-pay

## 2021-03-05 ENCOUNTER — Ambulatory Visit (INDEPENDENT_AMBULATORY_CARE_PROVIDER_SITE_OTHER): Payer: Medicare Other | Admitting: Podiatry

## 2021-03-05 ENCOUNTER — Ambulatory Visit (INDEPENDENT_AMBULATORY_CARE_PROVIDER_SITE_OTHER): Payer: Medicare Other

## 2021-03-05 ENCOUNTER — Encounter: Payer: Self-pay | Admitting: Podiatry

## 2021-03-05 DIAGNOSIS — M7752 Other enthesopathy of left foot: Secondary | ICD-10-CM | POA: Diagnosis not present

## 2021-03-05 DIAGNOSIS — M7750 Other enthesopathy of unspecified foot: Secondary | ICD-10-CM

## 2021-03-05 DIAGNOSIS — B351 Tinea unguium: Secondary | ICD-10-CM | POA: Diagnosis not present

## 2021-03-05 MED ORDER — TRIAMCINOLONE ACETONIDE 10 MG/ML IJ SUSP
10.0000 mg | Freq: Once | INTRAMUSCULAR | Status: AC
  Administered 2021-03-05: 10 mg

## 2021-03-05 NOTE — Progress Notes (Signed)
Subjective:   Patient ID: Julian Porter, male   DOB: 70 y.o.   MRN: [id number]   HPI Patient presents stating he has had overall foot problems over the years and is developed a lot of pain in his left ankle recently.  Does not remember specific injury change in shoe gear or activity level.  Patient does not smoke likes to be active   Review of Systems  All other systems reviewed and are negative.      Objective:  Physical Exam Vitals and nursing note reviewed.  Constitutional:      Appearance: He is well-developed.  Pulmonary:     Effort: Pulmonary effort is normal.  Musculoskeletal:        General: Normal range of motion.  Skin:    General: Skin is warm.  Neurological:     Mental Status: He is alert.    Neurovascular status intact muscle strength adequate range of motion within normal limits moderate flatfoot deformity bilateral with mild discomfort and inflammation of the ankle and subtalar joint left with also discoloration of nailbeds hallux bilateral with thickness 9 painful.  Good digital perfusion well oriented     Assessment:  Inflammatory capsulitis sinus tarsi bilateral with inflammation along ectopic nail infection tendinitis     Plan:  H&P reviewed all conditions sterile prep and injected the sinus tarsi left 3 mg Kenalog 5 mg Xylocaine advised on supportive shoes and recommended no treatment currently for the nail but did discuss nail and considerations for treatment.  Possible orthotics for the future and will wear good supportive shoe gear which I discussed with him today  X-rays indicate moderate flatfoot deformity no indications of advanced arthritis or stress fracture associated with inflammation pain

## 2021-05-04 ENCOUNTER — Other Ambulatory Visit: Payer: Self-pay | Admitting: Internal Medicine

## 2021-05-04 DIAGNOSIS — E785 Hyperlipidemia, unspecified: Secondary | ICD-10-CM

## 2021-05-25 ENCOUNTER — Ambulatory Visit
Admission: RE | Admit: 2021-05-25 | Discharge: 2021-05-25 | Disposition: A | Discharge status: Home / Self Care | Payer: Self-pay | Source: Ambulatory Visit | Attending: Internal Medicine | Admitting: Internal Medicine

## 2021-05-25 DIAGNOSIS — E785 Hyperlipidemia, unspecified: Secondary | ICD-10-CM

## 2022-11-12 IMAGING — CT CT CARDIAC CORONARY ARTERY CALCIUM SCORE
3 series · 14 of 20 positions shown, 16 images · non-contrast
Comparison: None.

CLINICAL DATA: 70-year-old African American male with history of
hyperlipidemia, hypertension and diabetes.

EXAM:
CT CARDIAC CORONARY ARTERY CALCIUM SCORE
TECHNIQUE: Non-contrast imaging through the heart was performed using
prospective ECG gating. Image post processing was performed on an
independent workstation, allowing for quantitative analysis of the
heart and coronary arteries. Note that this exam targets the heart
and the chest was not imaged in its entirety.

[Series 2: calcium scoring 2.00 qr36 bestdiast 67% hrt calciu · axial · 0.46mm/px · z∈[+1638,+1728]mm · 4 of 77 slices shown]
[im 16/77  vessel]
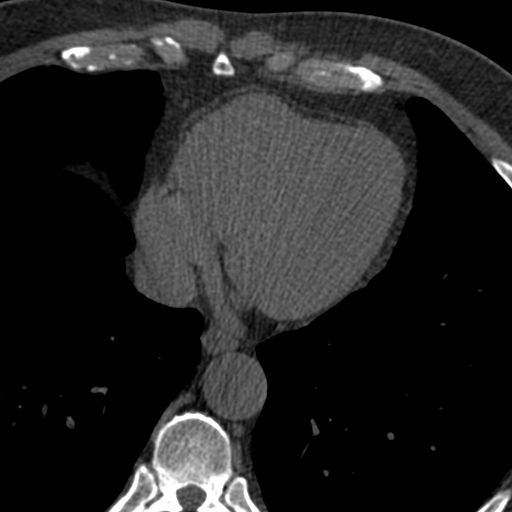
[im 31/77  vessel]
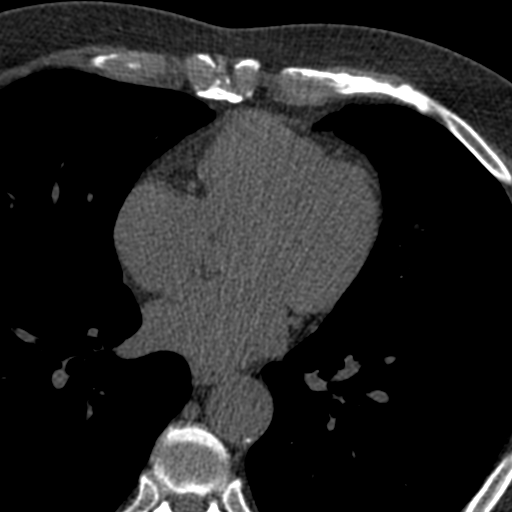
[im 46/77  vessel]
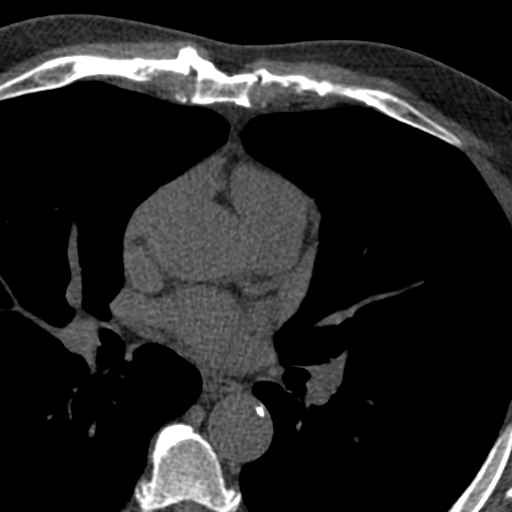
[im 61/77  vessel]
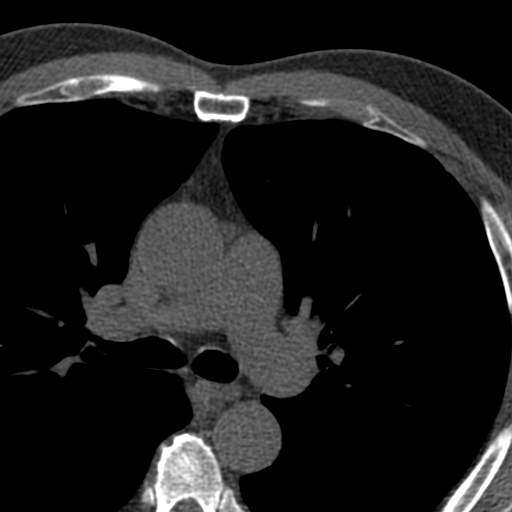

[Series 3: calcium scoring 2.00 br40 bestdiast 67% axial · axial · 0.68mm/px · z∈[+1628,+1732]mm · 5 of 80 slices shown, 7 images]
[im 14/80  vessel]
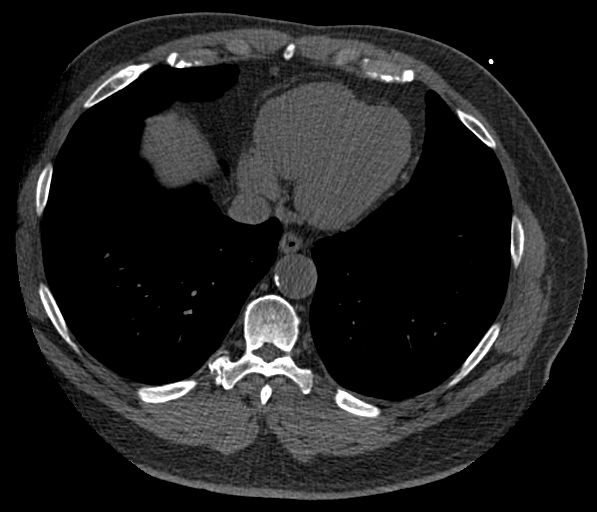
[im 14/80  lung]
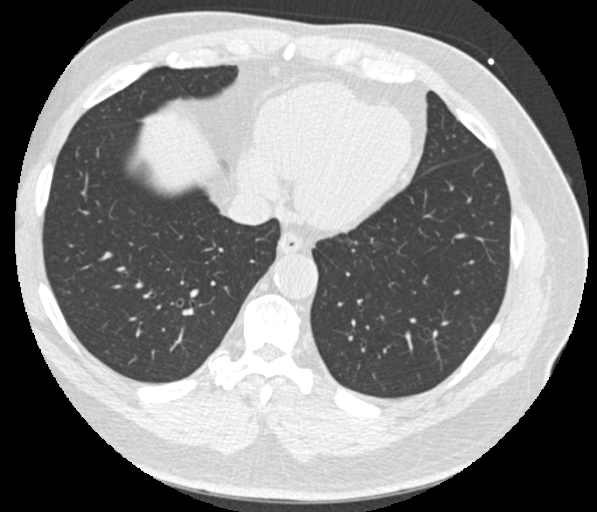
[im 27/80  vessel]
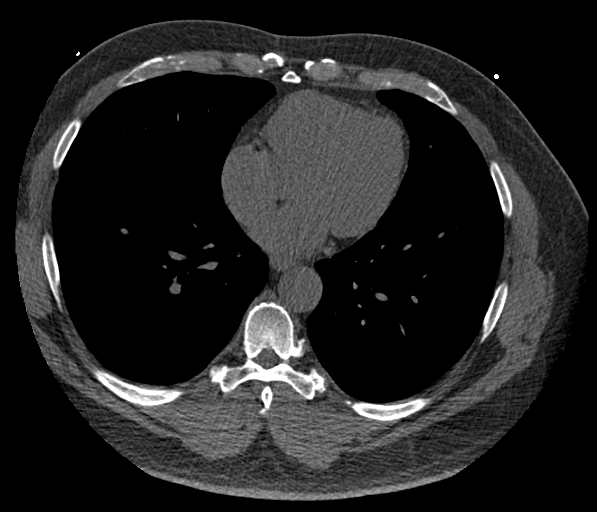
[im 40/80  vessel]
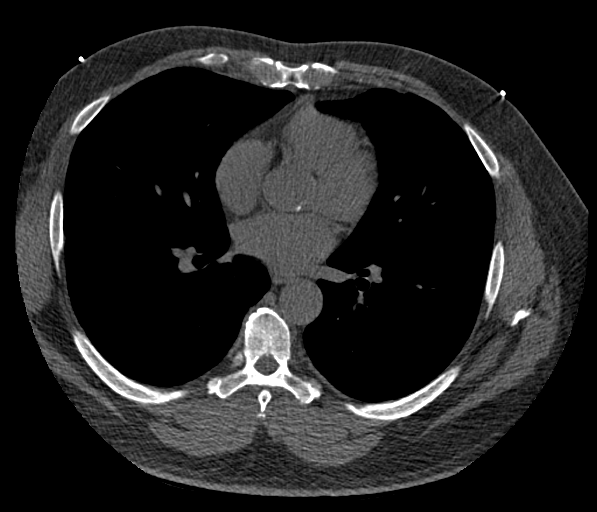
[im 53/80  vessel]
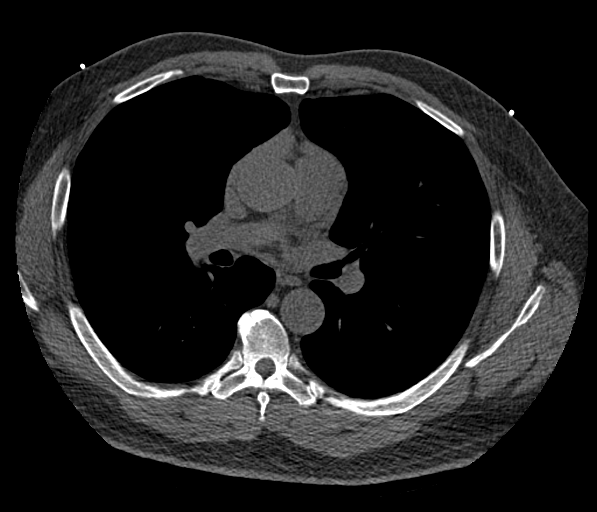
[im 66/80  vessel]
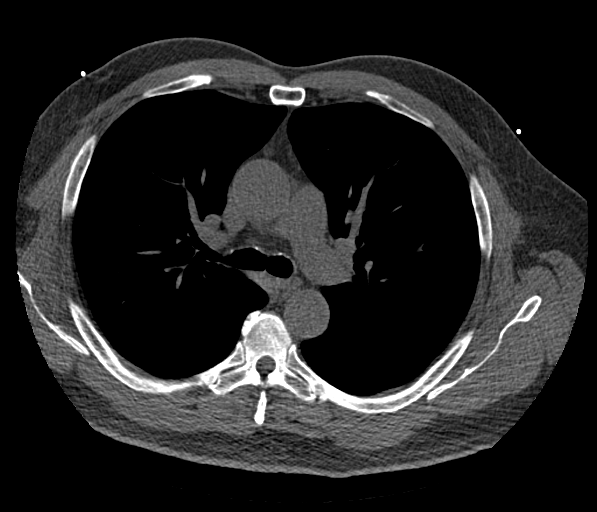
[im 66/80  lung]
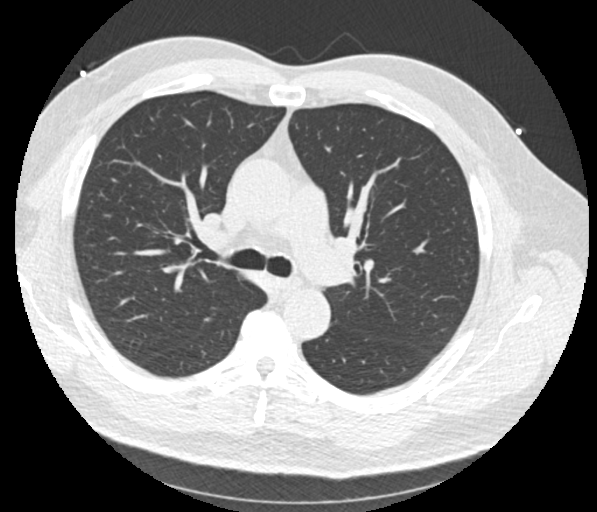

[Series 9: calcium scoring 2.00 br60 bestdiast 67% lungs · axial · 0.68mm/px · z∈[+1628,+1732]mm · 5 of 80 slices shown]
[im 14/80  vessel]
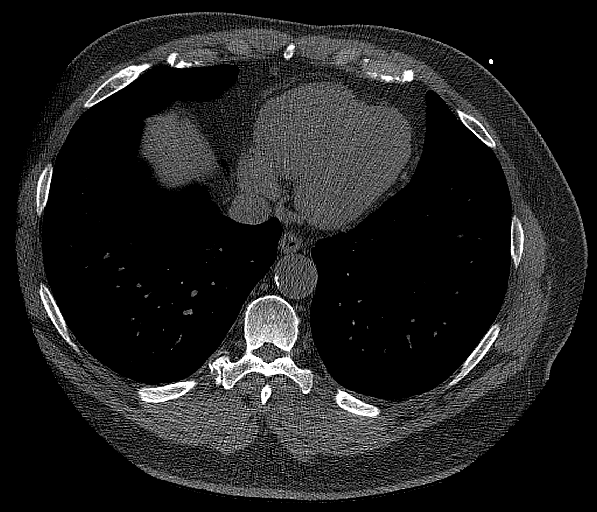
[im 27/80  vessel]
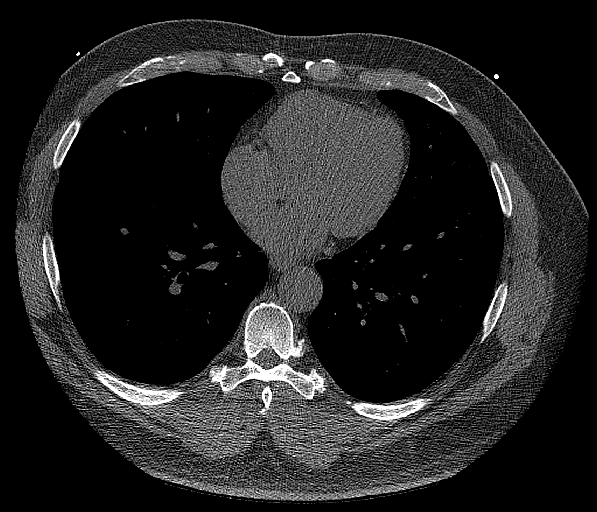
[im 40/80  vessel]
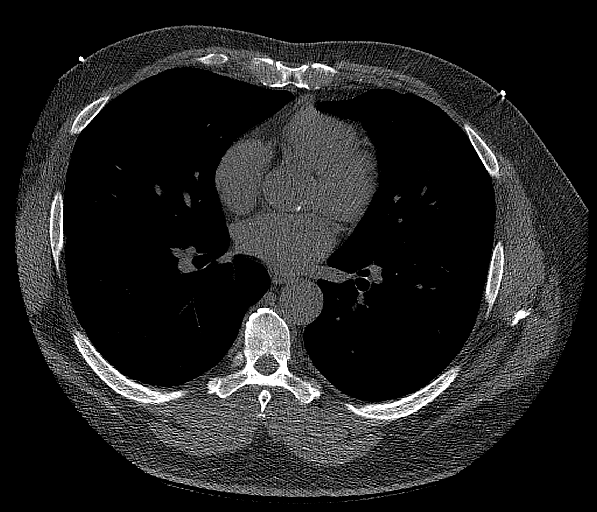
[im 53/80  vessel]
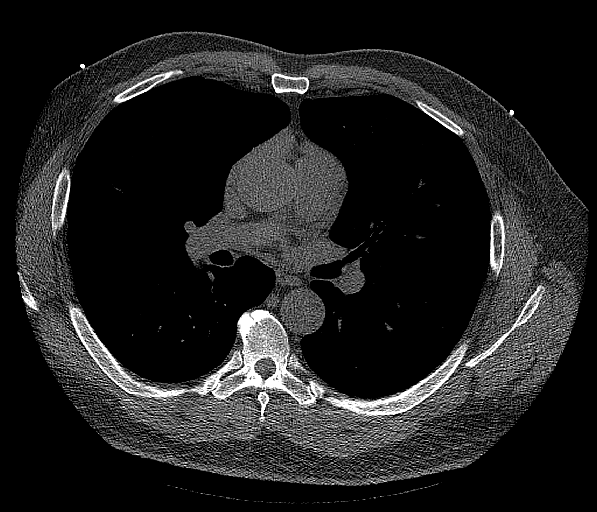
[im 66/80  vessel]
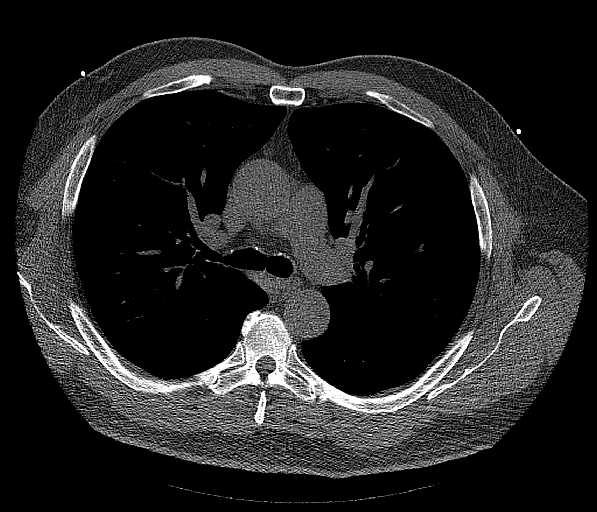

[14 of 20 positions shown; findings below may reference images not displayed]

FINDINGS: CORONARY CALCIUM SCORES:

Left Main: 0

LAD:

LCx: 0

RCA:

Total Agatston Score:

[HOSPITAL] percentile: 70

AORTA MEASUREMENTS:

Ascending Aorta: 38 mm

Descending Aorta: 29 mm

OTHER FINDINGS:

The heart size is within normal limits. No pericardial fluid is
identified. The thoracic aorta demonstrates atherosclerosis.
Visualized segments of the thoracic aorta and central pulmonary
arteries are normal in caliber. Visualized mediastinum and hilar
regions demonstrate no lymphadenopathy or masses. Visualized lungs
show no evidence of pulmonary edema, consolidation, pneumothorax,
nodule or pleural fluid. Visualized upper abdomen and bony
structures are unremarkable.
IMPRESSION: Coronary calcium score 121.2 is at the 70th percentile for the
patient's age, sex and race.

## 2024-01-30 ENCOUNTER — Other Ambulatory Visit: Payer: Self-pay | Admitting: Gastroenterology

## 2024-01-30 DIAGNOSIS — K760 Fatty (change of) liver, not elsewhere classified: Secondary | ICD-10-CM

## 2024-02-09 ENCOUNTER — Ambulatory Visit
Admission: RE | Admit: 2024-02-09 | Discharge: 2024-02-09 | Disposition: A | Discharge status: Home / Self Care | Source: Ambulatory Visit | Attending: Gastroenterology | Admitting: Gastroenterology

## 2024-02-09 DIAGNOSIS — K760 Fatty (change of) liver, not elsewhere classified: Secondary | ICD-10-CM
